# Patient Record
Sex: Female | Born: 1965 | Race: White | Hispanic: No | Smoking: Never smoker
Health system: Southern US, Community
[De-identification: ages and names within clinical notes are randomized; demographics above are authoritative.]

## PROBLEM LIST (undated history)

## (undated) DIAGNOSIS — J45909 Unspecified asthma, uncomplicated: Secondary | ICD-10-CM

## (undated) DIAGNOSIS — D6861 Antiphospholipid syndrome: Secondary | ICD-10-CM

## (undated) HISTORY — PX: CHOLECYSTECTOMY: SHX55

---

## 2004-12-26 ENCOUNTER — Encounter: Admission: RE | Admit: 2004-12-26 | Discharge: 2004-12-26 | Payer: Self-pay | Admitting: Neurology

## 2005-05-31 ENCOUNTER — Ambulatory Visit: Payer: Self-pay | Admitting: Cardiovascular Disease

## 2005-06-16 ENCOUNTER — Ambulatory Visit: Payer: Self-pay | Admitting: Cardiovascular Disease

## 2006-08-15 ENCOUNTER — Ambulatory Visit: Payer: Self-pay | Admitting: Surgery

## 2006-09-28 ENCOUNTER — Ambulatory Visit (HOSPITAL_COMMUNITY): Admission: RE | Admit: 2006-09-28 | Discharge: 2006-09-28 | Payer: Self-pay | Admitting: Surgery

## 2008-09-23 ENCOUNTER — Ambulatory Visit: Payer: Self-pay | Admitting: Internal Medicine

## 2008-10-09 ENCOUNTER — Ambulatory Visit: Payer: Self-pay | Admitting: Internal Medicine

## 2008-10-09 ENCOUNTER — Ambulatory Visit: Payer: Self-pay | Admitting: Cardiovascular Disease

## 2008-10-21 ENCOUNTER — Ambulatory Visit: Payer: Self-pay | Admitting: Internal Medicine

## 2010-09-13 ENCOUNTER — Encounter: Payer: Self-pay | Admitting: Surgery

## 2011-05-03 ENCOUNTER — Emergency Department: Payer: Self-pay | Admitting: Unknown Physician Specialty

## 2014-05-12 ENCOUNTER — Emergency Department: Payer: Self-pay | Admitting: Emergency Medicine

## 2014-05-12 LAB — COMPREHENSIVE METABOLIC PANEL
ALK PHOS: 46 U/L
AST: 36 U/L (ref 15–37)
Albumin: 3.8 g/dL (ref 3.4–5.0)
Anion Gap: 12 (ref 7–16)
BILIRUBIN TOTAL: 0.4 mg/dL (ref 0.2–1.0)
BUN: 19 mg/dL — AB (ref 7–18)
CALCIUM: 8.6 mg/dL (ref 8.5–10.1)
CHLORIDE: 108 mmol/L — AB (ref 98–107)
CREATININE: 0.77 mg/dL (ref 0.60–1.30)
Co2: 21 mmol/L (ref 21–32)
GLUCOSE: 99 mg/dL (ref 65–99)
Osmolality: 284 (ref 275–301)
Potassium: 4.2 mmol/L (ref 3.5–5.1)
SGPT (ALT): 25 U/L
SODIUM: 141 mmol/L (ref 136–145)
Total Protein: 7.1 g/dL (ref 6.4–8.2)

## 2014-05-12 LAB — CBC
HCT: 40.2 % (ref 35.0–47.0)
HGB: 12.9 g/dL (ref 12.0–16.0)
MCH: 27.3 pg (ref 26.0–34.0)
MCHC: 32.1 g/dL (ref 32.0–36.0)
MCV: 85 fL (ref 80–100)
PLATELETS: 235 10*3/uL (ref 150–440)
RBC: 4.73 10*6/uL (ref 3.80–5.20)
RDW: 12.9 % (ref 11.5–14.5)
WBC: 7.1 10*3/uL (ref 3.6–11.0)

## 2014-05-12 LAB — PROTIME-INR
INR: 0.9
PROTHROMBIN TIME: 12.4 s (ref 11.5–14.7)

## 2014-05-12 LAB — APTT: Activated PTT: 26.1 secs (ref 23.6–35.9)

## 2014-05-12 LAB — HCG, QUANTITATIVE, PREGNANCY: Beta Hcg, Quant.: <1 m[IU]/mL — ABNORMAL LOW

## 2014-05-12 LAB — TROPONIN I: Troponin-I: <0.02 ng/mL

## 2014-11-05 ENCOUNTER — Emergency Department: Payer: Self-pay | Admitting: Emergency Medicine

## 2014-11-08 ENCOUNTER — Emergency Department: Payer: Self-pay | Admitting: Emergency Medicine

## 2014-12-14 NOTE — Consult Note (Signed)
PATIENT NAME:  Shannon Poole, Winston R MR#:  [id number] DATE OF BIRTH:  01-13-1966  DATE OF CONSULTATION:  05/12/2014  REFERRING PHYSICIAN:  Su Leyobert L. Kinner, MD with the Emergency Room.  CONSULTING PHYSICIAN:  Jenalyn Girdner R. Tyquon Near, MD  CHIEF COMPLAINT: Right-sided chest pain radiating to the arm.   HISTORY OF PRESENTING ILLNESS: A 49 year old female patient with a history of antiphospholipid syndrome, chronic on-and-off chest pain prior to the Emergency Room, complaining of acute onset of right-sided chest pain which started 3 days back. The patient was hoping this pain would improve, but this continued without any change and presented to the ER. She has been taking some ibuprofen at home without any relief. Here, the patient was initially found to be tachycardic, was thought to have possible PE secondary to her history of antiphospholipid syndrome. Had a V/Q scan done which is completely normal. The patient does not have any shortness of breath. No hypoxia. Did not have any recent surgeries, long flight or car journeys, or immobility. The patient [facility] and she was treated for pleurisy, had extensive workup without any acute illness. The patient also had similar chest pain in the past, had stress test with Dr. Welton FlakesKhan of cardiology in 2008, which was normal, although, unfortunately, etiology could not be found for these recurrent pains.   PAST MEDICAL HISTORY:  1.  Antiphospholipid syndrome.  2.  Miscarriages.  3.  Asthma.  4.  Migraines.  5.  Cholecystectomy.   SOCIAL HISTORY: The patient does not smoke. Rare alcohol use. Works as a Lawyer[employer]NA.   FAMILY HISTORY: No family history of DVT, PE.   ALLERGIES: IV DYE AND MACROBID.   HOME MEDICATIONS: Ibuprofen 600 mg 2 times a day as needed for pain.   REVIEW OF SYSTEMS: CONSTITUTIONAL: No fever, fatigue, weight loss, weight gain. EYES: No blurry vision, pain.  HENT: No tinnitus, pain, hearing loss.  RESPIRATORY: No  cough, wheeze, hemoptysis. CARDIOVASCULAR: No chest pain, orthopnea, edema.  GASTROINTESTINAL: No nausea, vomiting, diarrhea, abdominal pain.  GENITOURINARY: No dysuria, hematuria, or frequency.  ENDOCRINE: No polyuria, nocturia, thyroid problems. HEMATOLOGIC AND LYMPHATIC: Has antiphospholipid syndrome.  MUSCULOSKELETAL: Has the right shoulder pain and chest pain.  NEUROLOGIC: No focal numbness, weakness, seizure.  PSYCHIATRIC: Has anxiety. No depression.  PHYSICAL EXAMINATION: Shows:  VITAL SIGNS: Temperature of 98.7. Initially pulse elevated at 120, presently improved to 90.  GENERAL: A morbidly obese Caucasian female patient lying in bed in distress secondary to her pain in the right shoulder chest area.  PSYCHIATRIC: Alert and oriented x 3, anxious.  HEENT: Atraumatic, normocephalic. Oral mucosa moist and pink. External ears normal. No pallor. No icterus. Pupils bilaterally equal and reactive to light.  NECK: Supple . Trachea midline. No carotid bruit or JVD. CARDIOVASCULAR: S1, S2 without any murmurs. Peripheral pulses 2+. No edema.  RESPIRATORY: Normal work of breathing. Clear to auscultation on both sides.  GASTROINTESTINAL: Soft abdomen, nontender. Bowel sounds present. No organomegaly palpable.  SKIN: Warm and dry. No petechiae, rash, ulcers.  MUSCULOSKELETAL: No joint tenderness in large joints. Normal muscle tone.  NEUROLOGIC: Motor strength 5/5 in upper and lower extremities. Sensation is intact all over.  LYMPHATIC: No cervical lymphadenopathy.   LABORATORY STUDIES: Show glucose of 99, BUN 19, creatinine 0.77, sodium 141, potassium 4.2, chloride 108, bicarbonate 21. AST, ALT, alkaline phosphatase, bilirubin normal. Troponin less than 0.02.   hemoglobin 12.9, platelets of 235,000 with INR of 0.98. PTT of 26.1.   EKG shows  normal sinus rhythm, nothing acute. Sinus tachycardia.   Chest x-ray looks clear.   V/Q scan negative for PE.   ASSESSMENT AND PLAN:  1.   Musculoskeletal right-sided chest-shoulder area pain. The patient will be given intravenous dose of Toradol along with a dose of Percocet and Flexeril. If the patient improves, can be discharged home. If the patient does not improve in the Emergency Room, may need admission. I discussed this plan with Dr. Cyril Loosen. She needs to follow up with her primary care physician. The patient's cardiac enzymes are normal. No risk factors for pulmonary embolism other than her antiphospholipid syndrome, but a V/Q scan has been negative which is reassuring. The patient's sinus tachycardia is likely from the pain, which has resolved here once the pain is improved.  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 Does not have any cough. No shortness of breath. Not an open case of tuberculosis. Will need INH and will follow up with her primary care physician.  3.  Antiphospholipid syndrome. Follow up with primary care physician.   TIME SPENT: Time spent today on this consult was 40 minutes.   ____________________________ Molinda Bailiff Izaiah Tabb, MD srs:ST D: 05/12/2014 21:44:17 ET T: 05/13/2014 00:25:46 ET JOB#: [id number]  cc: Wardell Heath R. Clydia Nieves, MD, <Dictator> Orie Fisherman MD ELECTRONICALLY SIGNED 05/15/2014 16:01

## 2015-04-09 ENCOUNTER — Observation Stay
Admission: EM | Admit: 2015-04-09 | Discharge: 2015-04-11 | Disposition: A | Discharge status: Home / Self Care | Payer: Worker's Compensation | Attending: Internal Medicine | Admitting: Internal Medicine

## 2015-04-09 ENCOUNTER — Encounter: Payer: Self-pay | Admitting: *Deleted

## 2015-04-09 ENCOUNTER — Other Ambulatory Visit: Payer: Self-pay

## 2015-04-09 ENCOUNTER — Emergency Department: Payer: Worker's Compensation

## 2015-04-09 DIAGNOSIS — Z9049 Acquired absence of other specified parts of digestive tract: Secondary | ICD-10-CM | POA: Insufficient documentation

## 2015-04-09 DIAGNOSIS — M545 Low back pain: Secondary | ICD-10-CM | POA: Insufficient documentation

## 2015-04-09 DIAGNOSIS — J45909 Unspecified asthma, uncomplicated: Secondary | ICD-10-CM | POA: Insufficient documentation

## 2015-04-09 DIAGNOSIS — R7989 Other specified abnormal findings of blood chemistry: Secondary | ICD-10-CM

## 2015-04-09 DIAGNOSIS — Z79899 Other long term (current) drug therapy: Secondary | ICD-10-CM | POA: Insufficient documentation

## 2015-04-09 DIAGNOSIS — R0602 Shortness of breath: Secondary | ICD-10-CM | POA: Insufficient documentation

## 2015-04-09 DIAGNOSIS — R0789 Other chest pain: Principal | ICD-10-CM | POA: Insufficient documentation

## 2015-04-09 DIAGNOSIS — Z885 Allergy status to narcotic agent status: Secondary | ICD-10-CM | POA: Insufficient documentation

## 2015-04-09 DIAGNOSIS — Z8249 Family history of ischemic heart disease and other diseases of the circulatory system: Secondary | ICD-10-CM | POA: Insufficient documentation

## 2015-04-09 DIAGNOSIS — R000 Tachycardia, unspecified: Secondary | ICD-10-CM | POA: Insufficient documentation

## 2015-04-09 DIAGNOSIS — E039 Hypothyroidism, unspecified: Secondary | ICD-10-CM | POA: Insufficient documentation

## 2015-04-09 DIAGNOSIS — D6861 Antiphospholipid syndrome: Secondary | ICD-10-CM | POA: Insufficient documentation

## 2015-04-09 DIAGNOSIS — Z808 Family history of malignant neoplasm of other organs or systems: Secondary | ICD-10-CM | POA: Insufficient documentation

## 2015-04-09 DIAGNOSIS — Z806 Family history of leukemia: Secondary | ICD-10-CM | POA: Insufficient documentation

## 2015-04-09 DIAGNOSIS — Z888 Allergy status to other drugs, medicaments and biological substances status: Secondary | ICD-10-CM | POA: Insufficient documentation

## 2015-04-09 DIAGNOSIS — Z91041 Radiographic dye allergy status: Secondary | ICD-10-CM | POA: Insufficient documentation

## 2015-04-09 DIAGNOSIS — R0781 Pleurodynia: Secondary | ICD-10-CM | POA: Insufficient documentation

## 2015-04-09 DIAGNOSIS — Z833 Family history of diabetes mellitus: Secondary | ICD-10-CM | POA: Insufficient documentation

## 2015-04-09 DIAGNOSIS — R079 Chest pain, unspecified: Secondary | ICD-10-CM | POA: Diagnosis present

## 2015-04-09 DIAGNOSIS — Z881 Allergy status to other antibiotic agents status: Secondary | ICD-10-CM | POA: Insufficient documentation

## 2015-04-09 HISTORY — DX: Unspecified asthma, uncomplicated: J45.909

## 2015-04-09 HISTORY — DX: Antiphospholipid syndrome: D68.61

## 2015-04-09 LAB — COMPREHENSIVE METABOLIC PANEL
ALBUMIN: 4.2 g/dL (ref 3.5–5.0)
ALT: 13 U/L — ABNORMAL LOW (ref 14–54)
AST: 23 U/L (ref 15–41)
Alkaline Phosphatase: 52 U/L (ref 38–126)
Anion gap: 10 (ref 5–15)
BILIRUBIN TOTAL: 0.6 mg/dL (ref 0.3–1.2)
BUN: 15 mg/dL (ref 6–20)
CHLORIDE: 104 mmol/L (ref 101–111)
CO2: 25 mmol/L (ref 22–32)
Calcium: 9 mg/dL (ref 8.9–10.3)
Creatinine, Ser: 0.96 mg/dL (ref 0.44–1.00)
GFR calc Af Amer: >60 mL/min (ref >60)
GFR calc non Af Amer: >60 mL/min (ref >60)
GLUCOSE: 91 mg/dL (ref 65–99)
POTASSIUM: 3.5 mmol/L (ref 3.5–5.1)
Sodium: 139 mmol/L (ref 135–145)
TOTAL PROTEIN: 7 g/dL (ref 6.5–8.1)

## 2015-04-09 LAB — FIBRIN DERIVATIVES D-DIMER (ARMC ONLY): Fibrin derivatives D-dimer (ARMC): 1014.89 — ABNORMAL HIGH (ref 0–499)

## 2015-04-09 LAB — CBC
HEMATOCRIT: 37.7 % (ref 35.0–47.0)
Hemoglobin: 12.1 g/dL (ref 12.0–16.0)
MCH: 24.8 pg — ABNORMAL LOW (ref 26.0–34.0)
MCHC: 32.1 g/dL (ref 32.0–36.0)
MCV: 77.3 fL — ABNORMAL LOW (ref 80.0–100.0)
Platelets: 250 10*3/uL (ref 150–440)
RBC: 4.88 MIL/uL (ref 3.80–5.20)
RDW: 14.9 % — AB (ref 11.5–14.5)
WBC: 6.3 10*3/uL (ref 3.6–11.0)

## 2015-04-09 LAB — CK: Total CK: 124 U/L (ref 38–234)

## 2015-04-09 LAB — TROPONIN I: Troponin I: <0.03 ng/mL (ref <0.031)

## 2015-04-09 LAB — POCT PREGNANCY, URINE: PREG TEST UR: NEGATIVE

## 2015-04-09 MED ORDER — ENOXAPARIN SODIUM 150 MG/ML ~~LOC~~ SOLN: 1.0000 mg/kg | Freq: Once | SUBCUTANEOUS | Status: AC

## 2015-04-09 MED ORDER — OXYCODONE-ACETAMINOPHEN 5-325 MG PO TABS
2.0000 | ORAL_TABLET | ORAL | Status: AC
  Administered 2015-04-09: 2 via ORAL
  Filled 2015-04-09: qty 2

## 2015-04-09 MED ORDER — ENOXAPARIN SODIUM 100 MG/ML ~~LOC~~ SOLN
SUBCUTANEOUS | Status: AC
  Administered 2015-04-09: 70 mg
  Filled 2015-04-09: qty 1

## 2015-04-09 NOTE — ED Notes (Signed)
Admitting physician in to evaluate patient for admission.

## 2015-04-09 NOTE — ED Notes (Signed)
Pt has chest pain.  Sx began 3 days ago.  States it hurts to take a deep breath.  Pt seen at urgent care today, sent to er for an eval.  Pt anxious.  Nausea today. No v/d.

## 2015-04-09 NOTE — H&P (Signed)
[facility] [place] [facility]   PATIENT NAME: Shannon Poole    MR#:  [id number]  DATE OF BIRTH:  02-27-1966  DATE OF ADMISSION:  04/09/2015  PRIMARY CARE PHYSICIAN: No primary care provider on file.   REQUESTING/REFERRING PHYSICIAN: Quale  CHIEF COMPLAINT:   Chief Complaint  Patient presents with  . Chest Pain    HISTORY OF PRESENT ILLNESS:  Shannon Poole  is a 49 y.o. female with a known history of antiphospholipid syndrome, hypothyroidism presents to the emergency room with the complaints of ongoing chest discomfort for the past 3 days. 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 Chest pain increases on deep inspiration and movements off chest wall. Mild associated shortness of breath intermittently +. Denies any palpitations, dizziness, loss of consciousness. No history of any recent fever, cough, vomiting, diarrhea mild nausea +. Patient gives history of antiphospholipid syndrome but denies any history of prior DVT or pulmonary embolism. History of heparin usage in one of her last pregnancy because of antiphospholipid syndrome. Evaluation in the ED revealed mild tachycardia with heart rate of 112 bpm, otherwise normal EKG and room air O2 saturations 100%. Chest x-ray negative for any acute cardiopulmonary pathology. CBC and CMP within normal limits. D-dimer elevated at 1014. Patient was started on therapeutic doses of subcutaneous Lovenox with a working diagnosis of pulmonary embolism because of chest pain in the setting of elevated d-dimer and history of antiphospholipid syndrome. VQ scan is ordered for tomorrow morning.  PAST MEDICAL HISTORY:   Past Medical History  Diagnosis Date  . Antiphospholipid syndrome    hypothyroidism  PAST SURGICAL HISTORY:   Past Surgical History  Procedure  Laterality Date  . Cholecystectomy    . Cesarean section N/A     SOCIAL HISTORY:   Social History  Substance Use Topics  . Smoking status: Never Smoker   . Smokeless tobacco: Not on file  . Alcohol Use: Yes    FAMILY HISTORY:   Family History  Problem Relation Age of Onset  . Diabetes Mother   . Leukemia Mother   . Congestive Heart Failure Father   . Cancer Other     DRUG ALLERGIES:   Allergies  Allergen Reactions  . Contrast Media [Iodinated Diagnostic Agents] Anaphylaxis  . Codeine Itching  . Macrolides And Ketolides   . Macrobid [employer] Macro] Rash    REVIEW OF SYSTEMS:   Review of Systems  Constitutional: Negative for fever, chills and malaise/fatigue.  HENT: Negative for ear pain, hearing loss, nosebleeds, sore throat and tinnitus.   Eyes: Negative for blurred vision, double vision, pain, discharge and redness.  Respiratory: Positive for shortness of breath. Negative for cough, hemoptysis, sputum production and wheezing.   Cardiovascular: Positive for chest pain. Negative for palpitations, orthopnea and leg swelling.  Gastrointestinal: Positive for nausea. Negative for vomiting, abdominal pain, diarrhea, constipation, blood in stool and melena.  Genitourinary: Negative for dysuria, urgency, frequency and hematuria.  Musculoskeletal: Negative for back pain, joint pain and neck pain.  Skin: Negative for itching and rash.  Neurological: Negative for dizziness, tingling, sensory change, focal weakness and seizures.  Endo/Heme/Allergies: Does not bruise/bleed easily.  Psychiatric/Behavioral: Negative for depression. The patient is not nervous/anxious.     MEDICATIONS AT HOME:   Prior to Admission medications   Medication Sig Start Date End  Date Taking? Authorizing Provider  cyclobenzaprine (FLEXERIL) 10 MG tablet Take 10 mg by mouth daily as needed for muscle spasms.   Yes Historical Provider, MD  levothyroxine (SYNTHROID, LEVOTHROID) 50 MCG  tablet Take 50 mcg by mouth daily before breakfast.   Yes Historical Provider, MD  naproxen (NAPROSYN) 500 MG tablet Take 500 mg by mouth 2 (two) times daily with a meal.   Yes Historical Provider, MD      VITAL SIGNS:  Blood pressure 110/87, pulse 74, temperature 98.2 F (36.8 C), temperature source Oral, resp. rate 22, height 5\' 4"  (1.626 m), weight 71.668 kg (158 lb), last menstrual period 04/09/2015, SpO2 100 %.  PHYSICAL EXAMINATION:  Physical Exam  Constitutional: She is oriented to person, place, and time. She appears well-developed and well-nourished.  Mild to moderate distress because of chest pain +  HENT:  Head: Normocephalic and atraumatic.  Right Ear: External ear normal.  Left Ear: External ear normal.  Nose: Nose normal.  Mouth/Throat: Oropharynx is clear and moist. No oropharyngeal exudate.  Eyes: EOM are normal. Pupils are equal, round, and reactive to light. No scleral icterus.  Neck: Normal range of motion. Neck supple. No JVD present. No thyromegaly present.  Cardiovascular: Normal rate, regular rhythm, normal heart sounds and intact distal pulses.  Exam reveals no friction rub.   No murmur heard. Respiratory: Effort normal and breath sounds normal. No respiratory distress. She has no wheezes. She has no rales. She exhibits no tenderness.  GI: Soft. Bowel sounds are normal. She exhibits no distension and no mass. There is no tenderness. There is no rebound and no guarding.  Musculoskeletal: Normal range of motion. She exhibits no edema.  Lymphadenopathy:    She has no cervical adenopathy.  Neurological: She is alert and oriented to person, place, and time. She has normal reflexes. She displays normal reflexes. No cranial nerve deficit. She exhibits normal muscle tone.  Skin: Skin is warm. No rash noted. No erythema.  Psychiatric: She has a normal mood and affect. Her behavior is normal. Thought content normal.   LABORATORY PANEL:   CBC  Recent Labs Lab  04/09/15 1842  WBC 6.3  HGB 12.1  HCT 37.7  PLT 250   ------------------------------------------------------------------------------------------------------------------  Chemistries   Recent Labs Lab 04/09/15 1842  NA 139  K 3.5  CL 104  CO2 25  GLUCOSE 91  BUN 15  CREATININE 0.96  CALCIUM 9.0  AST 23  ALT 13*  ALKPHOS 52  BILITOT 0.6   ------------------------------------------------------------------------------------------------------------------  Cardiac Enzymes  Recent Labs Lab 04/09/15 1842  TROPONINI <0.03   ------------------------------------------------------------------------------------------------------------------  RADIOLOGY:  Dg Chest 2 View  04/09/2015   CLINICAL DATA:  Left chest pain/injury  EXAM: CHEST  2 VIEW  COMPARISON:  05/12/2014  FINDINGS: Lungs are clear.  No pleural effusion or pneumothorax.  The heart is normal in size.  Mild degenerative changes of the visualized thoracolumbar spine.  IMPRESSION: No evidence of acute cardiopulmonary disease.   Electronically Signed   By: Charline Bills M.D.   On: 04/09/2015 19:06    EKG:   Orders placed or performed in visit on 05/12/14  . EKG 12-Lead  Sinus tachycardia with ventricular rate of 112 bpm, no acute ischemic changes.  IMPRESSION AND PLAN:   49 year old Caucasian female with history of antiphospholipid syndrome presents with the complaints of chest pain, found to be mild tachycardia, not hypoxic on room air, elevated d-dimer. Problem #1. Chest pain with history of antiphospholipid syndrome with elevated d-dimer,  mildly tachycardic and without any hypoxia. Likely musculoskeletal etiology in nature. However pulmonary embolism needs to be ruled out. Troponin 1 WNL, EKG no acute ischemic changes. Plan: Admit for observation, continue therapeutic Lovenox, pain control medications, monitor O2 sats, order VQ scan. 2. History of antiphospholipid syndrome. 3. Hypothyroidism, stable on  Synthroid. Continue same.   All the records are reviewed and case discussed with ED provider. Management plans discussed with the patient and  in agreement.  CODE STATUS: full code  TOTAL TIME TAKING CARE OF THIS PATIENT: 45 minutes.    Crissie Figures M.D on 04/09/2015 at 11:43 PM  Between 7am to 6pm - Pager - [phone]  After 6pm go to [email or web address] - password [email or web address] [facility]  [facility]  Office  [phone]  CC: Primary care physician; No primary care provider on file.

## 2015-04-09 NOTE — ED Notes (Signed)
Patient was inquiring about workers comp since her being here is because of the injury she recieved at work around the 9th of this month.  However, since she stated she filed for it and completed the drug screen  at urgent care the day after the incident, I informed her that another one is not necessary.

## 2015-04-09 NOTE — ED Provider Notes (Signed)
[facility] Emergency Department Provider Note  ____________________________________________  Time seen: Approximately 8:50 PM  I have reviewed the triage vital signs and the nursing notes.   HISTORY  Chief Complaint Chest Pain    HPI Shannon Poole is a 49 y.o. female with a history of ant-phospholipid syndromebeen no previous history of DVT or pulmonary emb. 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 She was seen in urgent care and evaluated. She reports that she has ongoing pain in her lower back, it is more concerned about sharp severe chest pains. She reports that whenever she goes to lift or move anything she has severe upper chest pain across her pectoralis muscles. In addition she reports some mild shortness of breath. She does report that she was told she had clots in her uterus during a previous pregnancy, but is never had a DVT or pulmonary embolus more required anticoagulation.  No heavy chest pressure. She reports her pain is definitely worse with lifting, and she went to work today but because of the amount of pain that she is having with movement she could not make to her shift.  No previous history of cardiac disease. No history of high blood pressure Or cholesterolemia. She is nonsmoker.   Past Medical History  Diagnosis Date  . Antiphospholipid syndrome     There are no active problems to display for this patient.   No past surgical history on file.  No current outpatient prescriptions on file.  Allergies Contrast media; Macrolides and ketolides; and Macrobid  No family history on file.  Social History Social History  Substance Use Topics  . Smoking status: Never Smoker   . Smokeless tobacco: Not on file  . Alcohol Use: Yes    Review of Systems Constitutional: No fever/chills Eyes: No visual  changes. ENT: No sore throat. Cardiovascular: See history of present illness Respiratory: Some shortness of breath, especially when her chest is hurting the most with movement. Gastrointestinal: No abdominal pain.  No nausea, no vomiting.  No diarrhea.  No constipation. Genitourinary: Negative for dysuria. Musculoskeletal: Right lower back pain, described as aching and a "strain" Skin: Negative for rash. Neurological: Negative for headaches, focal weakness or numbness.  10-point ROS otherwise negative.  ____________________________________________   PHYSICAL EXAM:  VITAL SIGNS: ED Triage Vitals  Enc Vitals Group     BP 04/09/15 1839 100/62 mmHg     Pulse Rate 04/09/15 1839 96     Resp 04/09/15 1839 20     Temp 04/09/15 1839 98.2 F (36.8 C)     Temp Source 04/09/15 1839 Oral     SpO2 04/09/15 1839 100 %     Weight 04/09/15 1839 158 lb (71.668 kg)     Height 04/09/15 1839  (1.626 m)     Head Cir --      Peak Flow --      Pain Score 04/09/15 1839 10     Pain Loc --      Pain Edu? --      Excl. in GC? --     Constitutional: Alert and oriented. Well appearing and in no acute distress. Eyes: Conjunctivae are normal. PERRL. EOMI. Head: Atraumatic. Nose: No congestion/rhinnorhea. Mouth/Throat: Mucous membranes are moist.  Oropharynx non-erythematous. Neck: No stridor.   Cardiovascular: Normal rate, regular rhythm. Grossly normal heart sounds.  Good peripheral circulation. Respiratory: Normal respiratory effort.  No retractions. Lungs CTAB. Gastrointestinal: Soft and nontender. No distention. No abdominal bruits. No CVA tenderness. Musculoskeletal: No reproducible anterior chest tenderness, but does report tenderness across the chest and abducting the arms. Patient has moderate tenderness to palpation of the right paraspinous muscles along the lumbar spine. No lower extremity tenderness nor edema.  No joint effusions. Neurologic:  Normal speech and language. No gross focal  neurologic deficits are appreciated. No gait instability. Skin:  Skin is warm, dry and intact. No rash noted. Psychiatric: Mood and affect are normal. Speech and behavior are normal.  ____________________________________________   LABS (all labs ordered are listed, but only abnormal results are displayed)  Labs Reviewed  CBC - Abnormal; Notable for the following:    MCV 77.3 (*)    MCH 24.8 (*)    RDW 14.9 (*)    All other components within normal limits  COMPREHENSIVE METABOLIC PANEL - Abnormal; Notable for the following:    ALT 13 (*)    All other components within normal limits  FIBRIN DERIVATIVES D-DIMER ([facility] ONLY) - Abnormal; Notable for the following:    Fibrin derivatives D-dimer [facility]) 1014.89 (*)    All other components within normal limits  TROPONIN I  CK  POC URINE PREG, ED  POCT PREGNANCY, URINE   ____________________________________________  EKG Reviewed and are reviewed by me Sinus tachycardia PR 1:30 QRS 70 QTc 460 No T-wave abnormalities Interpreted as sinus tachycardia otherwise normal ____________________________________________  RADIOLOGY     DG Chest 2 View (Final result) Result time: 04/09/15 19:06:56   Final result by Rad Results In Interface (04/09/15 19:06:56)   Narrative:   CLINICAL DATA: Left chest pain/injury  EXAM: CHEST 2 VIEW  COMPARISON: 05/12/2014  FINDINGS: Lungs are clear. No pleural effusion or pneumothorax.  The heart is normal in size.  Mild degenerative changes of the visualized thoracolumbar spine.  IMPRESSION: No evidence of acute cardiopulmonary disease.     ____________________________________________   PROCEDURES  Procedure(s) performed: None  Critical Care performed: No  ____________________________________________   INITIAL IMPRESSION / ASSESSMENT AND PLAN / ED COURSE  Pertinent labs & imaging results that were available during my care of the patient were reviewed by me and considered in  my medical decision making (see chart for details).  Pleuritic type chest pain, possibly musculoskeletal in nature but also patient has a history of anti-phospholipid syndrome. She reports she was previously had been on heparin during her pregnancy and developed clots around the uterus, though this is a little bit unclear I do not believe she speaks history of DVT.  Chest pain seems more muscular skeletal in nature, but given tachycardia and 110 on arrival unable to use PERC. D-dimer is positive, and patient has anaphylaxis to contrast dye. Troponin negative, doubtful this represents acute coronary syndrome but does require rule out for pulmonary embolism.  Discussed with the patient and hospitalist, patient being admitted to the hospital for further workup of pleuritic chest pain. We'll empirically give 1 dose of Lovenox overnight, patient denies any history of bleeding problems. Not having active bleeding.  ____________________________________________   FINAL CLINICAL IMPRESSION(S) / ED DIAGNOSES  Final diagnoses:  Chest pain, pleuritic      Sharyn Creamer, MD 04/09/15 2221

## 2015-04-10 ENCOUNTER — Observation Stay: Payer: Worker's Compensation

## 2015-04-10 LAB — TROPONIN I: Troponin I: <0.03 ng/mL (ref <0.031)

## 2015-04-10 MED ORDER — MORPHINE SULFATE (PF) 2 MG/ML IV SOLN: 2.0000 mg | INTRAVENOUS | Status: AC

## 2015-04-10 MED ORDER — ASPIRIN EC 81 MG PO TBEC
81.0000 mg | DELAYED_RELEASE_TABLET | Freq: Every day | ORAL | Status: DC
  Administered 2015-04-10 – 2015-04-11 (×2): 81 mg via ORAL
  Filled 2015-04-10 (×2): qty 1

## 2015-04-10 MED ORDER — MORPHINE SULFATE (PF) 2 MG/ML IV SOLN
2.0000 mg | INTRAVENOUS | Status: DC | PRN
  Administered 2015-04-10 (×5): 2 mg via INTRAVENOUS
  Filled 2015-04-10 (×4): qty 1

## 2015-04-10 MED ORDER — TECHNETIUM TO 99M ALBUMIN AGGREGATED
3.0000 | Freq: Once | INTRAVENOUS | Status: AC | PRN
  Administered 2015-04-10: 2.05 via INTRAVENOUS

## 2015-04-10 MED ORDER — ACETAMINOPHEN 325 MG PO TABS: 650.0000 mg | ORAL_TABLET | Freq: Four times a day (QID) | ORAL | Status: DC | PRN

## 2015-04-10 MED ORDER — ENOXAPARIN SODIUM 80 MG/0.8ML ~~LOC~~ SOLN
1.0000 mg/kg | Freq: Two times a day (BID) | SUBCUTANEOUS | Status: DC
  Administered 2015-04-10: 70 mg via SUBCUTANEOUS
  Filled 2015-04-10: qty 0.8

## 2015-04-10 MED ORDER — NAPROXEN 500 MG PO TABS
ORAL_TABLET | ORAL | Status: AC
  Filled 2015-04-10: qty 1

## 2015-04-10 MED ORDER — LEVOTHYROXINE SODIUM 50 MCG PO TABS
50.0000 ug | ORAL_TABLET | Freq: Every day | ORAL | Status: DC
  Administered 2015-04-10 – 2015-04-11 (×2): 50 ug via ORAL
  Filled 2015-04-10 (×2): qty 1

## 2015-04-10 MED ORDER — MORPHINE SULFATE (PF) 2 MG/ML IV SOLN
INTRAVENOUS | Status: AC
  Filled 2015-04-10: qty 1

## 2015-04-10 MED ORDER — ONDANSETRON HCL 4 MG/2ML IJ SOLN: 4.0000 mg | Freq: Four times a day (QID) | INTRAMUSCULAR | Status: DC | PRN

## 2015-04-10 MED ORDER — OXYCODONE-ACETAMINOPHEN 5-325 MG PO TABS
1.0000 | ORAL_TABLET | ORAL | Status: DC | PRN
  Administered 2015-04-10 – 2015-04-11 (×2): 2 via ORAL
  Filled 2015-04-10 (×2): qty 2

## 2015-04-10 MED ORDER — ONDANSETRON HCL 4 MG PO TABS: 4.0000 mg | ORAL_TABLET | Freq: Four times a day (QID) | ORAL | Status: DC | PRN

## 2015-04-10 MED ORDER — SODIUM CHLORIDE 0.9 % IJ SOLN
3.0000 mL | Freq: Two times a day (BID) | INTRAMUSCULAR | Status: DC
  Administered 2015-04-10 – 2015-04-11 (×3): 3 mL via INTRAVENOUS

## 2015-04-10 MED ORDER — METHYLPREDNISOLONE SODIUM SUCC 125 MG IJ SOLR
60.0000 mg | Freq: Once | INTRAMUSCULAR | Status: AC
  Administered 2015-04-10: 60 mg via INTRAVENOUS
  Filled 2015-04-10: qty 2

## 2015-04-10 MED ORDER — ENOXAPARIN SODIUM 40 MG/0.4ML ~~LOC~~ SOLN
40.0000 mg | SUBCUTANEOUS | Status: DC
  Administered 2015-04-11: 40 mg via SUBCUTANEOUS
  Filled 2015-04-10: qty 0.4

## 2015-04-10 MED ORDER — ACETAMINOPHEN 650 MG RE SUPP: 650.0000 mg | Freq: Four times a day (QID) | RECTAL | Status: DC | PRN

## 2015-04-10 MED ORDER — CYCLOBENZAPRINE HCL 10 MG PO TABS
10.0000 mg | ORAL_TABLET | Freq: Three times a day (TID) | ORAL | Status: DC | PRN
  Administered 2015-04-10: 10 mg via ORAL
  Filled 2015-04-10: qty 1

## 2015-04-10 MED ORDER — TECHNETIUM TC 99M DIETHYLENETRIAME-PENTAACETIC ACID
43.2300 | Freq: Once | INTRAVENOUS | Status: DC | PRN
  Administered 2015-04-10: 43.23 via RESPIRATORY_TRACT
  Filled 2015-04-10: qty 43.23

## 2015-04-10 NOTE — Progress Notes (Signed)
Per MD give patient one time dose of 2 mg morphine stat IV

## 2015-04-10 NOTE — Progress Notes (Signed)
[facility] - [place] at [facility]   PATIENT NAME: Shannon Poole    MR#:  [id number]  DATE OF BIRTH:  01-30-1966  SUBJECTIVE:  CHIEF COMPLAINT:   Chief Complaint  Patient presents with  . Chest Pain   Complaints of pleuritic left chest pain. VQ scan is negative. D-dimer was elevated on admission.  REVIEW OF SYSTEMS:  Review of Systems  Constitutional: Negative for fever and chills.  HENT: Negative for ear pain.   Respiratory: Negative for cough, shortness of breath and wheezing.   Cardiovascular: Positive for chest pain. Negative for palpitations.       Pleuritic chest pain present.  Gastrointestinal: Negative for nausea, vomiting, abdominal pain, diarrhea and constipation.  Genitourinary: Negative for dysuria and frequency.  Neurological: Negative for dizziness, seizures, weakness and headaches.  Psychiatric/Behavioral: Negative for depression.    DRUG ALLERGIES:   Allergies  Allergen Reactions  . Contrast Media [Iodinated Diagnostic Agents] Anaphylaxis  . Codeine Itching  . Macrolides And Ketolides   . Macrobid [Nitrofurantoin Monohyd Macro] Rash    VITALS:  Blood pressure 107/76, pulse 89, temperature 98.3 F (36.8 C), temperature source Oral, resp. rate 18, height  (1.626 m), weight 70.081 kg (154 lb 8 oz), last menstrual period 04/08/2015, SpO2 100 %.  PHYSICAL EXAMINATION:  Physical Exam  GENERAL:  49 y.o.-year-old patient lying in the bed with no acute distress.  EYES: Pupils equal, round, reactive to light and accommodation. No scleral icterus. Extraocular muscles intact.  HEENT: Head atraumatic, normocephalic. Oropharynx and nasopharynx clear.  NECK:  Supple, no jugular venous distention. No thyroid enlargement, no tenderness.  LUNGS: Normal breath sounds bilaterally, no wheezing, rales,rhonchi or crepitation. No use of accessory muscles of respiration.  CARDIOVASCULAR: S1, S2 normal. No murmurs, rubs, or gallops.  Chest  clear on exam with inspection . no significant palpable tenderness noted. ABDOMEN: Soft, nontender, nondistended. Bowel sounds present. No organomegaly or mass.  EXTREMITIES: No pedal edema, cyanosis, or clubbing.  NEUROLOGIC: Cranial nerves II through XII are intact. Muscle strength 5/5 in all extremities. Sensation intact. Gait not checked.  PSYCHIATRIC: The patient is alert and oriented x 3.  SKIN: No obvious rash, lesion, or ulcer.    LABORATORY PANEL:   CBC  Recent Labs Lab 04/09/15 1842  WBC 6.3  HGB 12.1  HCT 37.7  PLT 250   ------------------------------------------------------------------------------------------------------------------  Chemistries   Recent Labs Lab 04/09/15 1842  NA 139  K 3.5  CL 104  CO2 25  GLUCOSE 91  BUN 15  CREATININE 0.96  CALCIUM 9.0  AST 23  ALT 13*  ALKPHOS 52  BILITOT 0.6   ------------------------------------------------------------------------------------------------------------------  Cardiac Enzymes  Recent Labs Lab 04/09/15 1842  TROPONINI <0.03   ------------------------------------------------------------------------------------------------------------------  RADIOLOGY:  Dg Chest 2 View  04/09/2015   CLINICAL DATA:  Left chest pain/injury  EXAM: CHEST  2 VIEW  COMPARISON:  05/12/2014  FINDINGS: Lungs are clear.  No pleural effusion or pneumothorax.  The heart is normal in size.  Mild degenerative changes of the visualized thoracolumbar spine.  IMPRESSION: No evidence of acute cardiopulmonary disease.   Electronically Signed   By: Charline Bills M.D.   On: 04/09/2015 19:06   Nm Pulmonary Perf And Vent  04/10/2015   CLINICAL DATA:  Chest pain and shortness of breath.  EXAM: NUCLEAR MEDICINE VENTILATION - PERFUSION LUNG SCAN  TECHNIQUE: Ventilation images were obtained in multiple projections using inhaled aerosol Tc-77m DTPA. Perfusion images were obtained in multiple projections after intravenous  injection of  Tc-52m MAA.  RADIOPHARMACEUTICALS:  42.234 mCi of Technetium-36m DTPA aerosol inhalation and 2.05 mCi of Technetium-16m MAA IV  COMPARISON:  Chest x-ray dated 04/09/2015  FINDINGS: Ventilation: No focal ventilation defect.  Perfusion: No wedge shaped peripheral perfusion defects to suggest acute pulmonary embolism.  IMPRESSION: Normal ventilation-perfusion lung scan. No evidence of pulmonary embolism.   Electronically Signed   By: Francene Boyers M.D.   On: 04/10/2015 14:07    EKG:   Orders placed or performed in visit on 05/12/14  . EKG 12-Lead    ASSESSMENT AND PLAN:   49 year old female with history of hypothyroidism and anti-phospholipid syndrome admitted to the hospital secondary to chest pain.  #1 chest pain-pleuritic in nature. -Either musculoskeletal in origin, pulmonary embolism needs to be ruled out due to her history of anti-phospholipid syndrome. -Since she is allergic to contrast, VQ scan has been ordered. D-dimer was elevated on admission. Also Dopplers of lower extremities will be done today. -Therapeutic dose of Lovenox given. Now that VQ scan is negative change Lovenox to daily. -Start muscle relaxants and pain medication. If no improvement try steroids.  #2 hypothyroidism-continue her Synthroid.  #3 anti-phospholipid syndrome-no active clots. On Lovenox prophylactically.  #4 DVT prophylaxis-on Lovenox.    All the records are reviewed and case discussed with Care Management/Social Workerr. Management plans discussed with the patient, family and they are in agreement.  CODE STATUS: Full code  TOTAL TIME TAKING CARE OF THIS PATIENT: 38 minutes.   POSSIBLE D/C IN 1 DAY, DEPENDING ON CLINICAL CONDITION.   Enid Baas M.D on 04/10/2015 at 2:29 PM  Between 7am to 6pm - Pager - [phone]  After 6pm go to [email or web address] - password [email or web address] [facility]  [facility]  Office  [phone]  CC: Primary care physician; No primary care provider on file.

## 2015-04-10 NOTE — Plan of Care (Signed)
Problem: Phase I Progression Outcomes Goal: Other Phase II Outcomes/Goals Outcome: Completed/Met Date Met:  04/10/15 Pt is alert and oriented x 4, up to bathroom independently, on room air, vital signs stable, c/o chest pain improved with morphine iV, lung scan and [place] of lower extremities negative for DVT and PE, patient started on IV steroids, good appetite, resting in between care, troponin remain negative, uneventful shift.

## 2015-04-10 NOTE — Progress Notes (Signed)
A&O. Independent. Admitted for chest pain, Ddimer positive. VQ scan today. SKin verified with Tiffany.

## 2015-04-11 DIAGNOSIS — R0789 Other chest pain: Secondary | ICD-10-CM

## 2015-04-11 LAB — BASIC METABOLIC PANEL
Anion gap: 5 (ref 5–15)
BUN: 21 mg/dL — ABNORMAL HIGH (ref 6–20)
CALCIUM: 9 mg/dL (ref 8.9–10.3)
CO2: 29 mmol/L (ref 22–32)
CREATININE: 0.7 mg/dL (ref 0.44–1.00)
Chloride: 105 mmol/L (ref 101–111)
GFR calc non Af Amer: >60 mL/min (ref >60)
GLUCOSE: 151 mg/dL — AB (ref 65–99)
Potassium: 4.6 mmol/L (ref 3.5–5.1)
Sodium: 139 mmol/L (ref 135–145)

## 2015-04-11 LAB — CK: Total CK: 91 U/L (ref 38–234)

## 2015-04-11 MED ORDER — OXYCODONE-ACETAMINOPHEN 5-325 MG PO TABS: 1.0000 | ORAL_TABLET | Freq: Four times a day (QID) | ORAL | Status: DC | PRN

## 2015-04-11 MED ORDER — PREDNISONE 10 MG (21) PO TBPK: 10.0000 mg | ORAL_TABLET | Freq: Every day | ORAL | Status: DC

## 2015-04-11 NOTE — Discharge Summary (Signed)
[facility] - [place] at [facility]   PATIENT NAME: Shannon Poole    MR#:  [id number]  DATE OF BIRTH:  28-Mar-1966  DATE OF ADMISSION:  04/09/2015 ADMITTING PHYSICIAN: Crissie Figures, MD  DATE OF DISCHARGE: 04/11/15  PRIMARY CARE PHYSICIAN: No primary care provider on file.    ADMISSION DIAGNOSIS:  Chest pain, pleuritic [R07.81]  DISCHARGE DIAGNOSIS:  Principal Problem:   Chest pain Active Problems:   Musculoskeletal chest pain   SECONDARY DIAGNOSIS:   Past Medical History  Diagnosis Date  . Antiphospholipid syndrome   . Asthma     HOSPITAL COURSE:   49 year old female with history of hypothyroidism and anti-phospholipid syndrome admitted to the hospital secondary to chest pain.  #1 chest pain-pleuritic in nature. -likely musculoskeletal in origin, with inflammatory symptoms -Negative for  pulmonary embolism and DVT based on VQ scan and dopplers -improving with muscle relaxants and steroids- discharge on the same.  #2 hypothyroidism-continue her Synthroid.  #3 anti-phospholipid syndrome-no active clots.   DISCHARGE CONDITIONS:   Stable  CONSULTS OBTAINED:  Treatment Team:  Enid Baas, MD  DRUG ALLERGIES:   Allergies  Allergen Reactions  . Contrast Media [Iodinated Diagnostic Agents] Anaphylaxis  . Codeine Itching  . Macrolides And Ketolides   . Macrobid [Nitrofurantoin Monohyd Macro] Rash    DISCHARGE MEDICATIONS:   Current Discharge Medication List    START taking these medications   Details  oxyCODONE-acetaminophen (PERCOCET/ROXICET) 5-325 MG per tablet Take 1-2 tablets by mouth every 6 (six) hours as needed for moderate pain. Qty: 20 tablet, Refills: 0    predniSONE (STERAPRED UNI-PAK 21 TAB) 10 MG (21) TBPK tablet Take 1 tablet (10 mg total) by mouth daily. 6 tabs PO x 1 day 5 tabs PO x 1 day 4 tabs PO x 1 day 3 tabs PO x 1 day 2 tabs PO x 1 day 1 tab PO x 1 day and stop Qty: 21 tablet, Refills: 0       CONTINUE these medications which have NOT CHANGED   Details  cyclobenzaprine (FLEXERIL) 10 MG tablet Take 10 mg by mouth daily as needed for muscle spasms.    levothyroxine (SYNTHROID, LEVOTHROID) 50 MCG tablet Take 50 mcg by mouth daily before breakfast.      STOP taking these medications     naproxen (NAPROSYN) 500 MG tablet          DISCHARGE INSTRUCTIONS:   1. F/u with physician in 1 week if no improvement  If you experience worsening of your admission symptoms, develop shortness of breath, life threatening emergency, suicidal or homicidal thoughts you must seek medical attention immediately by calling 911 or calling your MD immediately  if symptoms less severe.  You Must read complete instructions/literature along with all the possible adverse reactions/side effects for all the Medicines you take and that have been prescribed to you. Take any new Medicines after you have completely understood and accept all the possible adverse reactions/side effects.   Please note  You were cared for by a hospitalist during your hospital stay. If you have any questions about your discharge medications or the care you received while you were in the hospital after you are discharged, you can call the unit and asked to speak with the hospitalist on call if the hospitalist that took care of you is not available. Once you are discharged, your primary care physician will handle any further medical issues. Please note that NO REFILLS for any discharge medications will be  authorized once you are discharged, as it is imperative that you return to your primary care physician (or establish a relationship with a primary care physician if you do not have one) for your aftercare needs so that they can reassess your need for medications and monitor your lab values.    Today   CHIEF COMPLAINT:   Chief Complaint  Patient presents with  . Chest Pain     VITAL SIGNS:  Blood pressure 98/60, pulse 70,  temperature 97.8 F (36.6 C), temperature source Oral, resp. rate 18, height  (1.626 m), weight 69.718 kg (153 lb 11.2 oz), last menstrual period 04/08/2015, SpO2 98 %.  I/O:   Intake/Output Summary (Last 24 hours) at 04/11/15 0931 Last data filed at 04/11/15 1191  Gross per 24 hour  Intake    240 ml  Output   1450 ml  Net  -1210 ml    PHYSICAL EXAMINATION:   Physical Exam  GENERAL:  49 y.o.-year-old patient lying in the bed with no acute distress.  EYES: Pupils equal, round, reactive to light and accommodation. No scleral icterus. Extraocular muscles intact.  HEENT: Head atraumatic, normocephalic. Oropharynx and nasopharynx clear.  NECK:  Supple, no jugular venous distention. No thyroid enlargement, no tenderness.  LUNGS: Normal breath sounds bilaterally, no wheezing, rales,rhonchi or crepitation. No use of accessory muscles of respiration.  CARDIOVASCULAR: S1, S2 normal. No murmurs, rubs, or gallops.  ABDOMEN: Soft, non-tender, non-distended. Bowel sounds present. No organomegaly or mass.  EXTREMITIES: No pedal edema, cyanosis, or clubbing.  NEUROLOGIC: Cranial nerves II through XII are intact. Muscle strength 5/5 in all extremities. Sensation intact. Gait not checked.  PSYCHIATRIC: The patient is alert and oriented x 3.  SKIN: No obvious rash, lesion, or ulcer.   DATA REVIEW:   CBC  Recent Labs Lab 04/09/15 1842  WBC 6.3  HGB 12.1  HCT 37.7  PLT 250    Chemistries   Recent Labs Lab 04/09/15 1842 04/11/15 0439  NA 139 139  K 3.5 4.6  CL 104 105  CO2 25 29  GLUCOSE 91 151*  BUN 15 21*  CREATININE 0.96 0.70  CALCIUM 9.0 9.0  AST 23  --   ALT 13*  --   ALKPHOS 52  --   BILITOT 0.6  --     Cardiac Enzymes  Recent Labs Lab 04/10/15 2024  TROPONINI <0.03    Microbiology Results  No results found for this or any previous visit.  RADIOLOGY:  Dg Chest 2 View  04/09/2015   CLINICAL DATA:  Left chest pain/injury  EXAM: CHEST  2 VIEW  COMPARISON:   05/12/2014  FINDINGS: Lungs are clear.  No pleural effusion or pneumothorax.  The heart is normal in size.  Mild degenerative changes of the visualized thoracolumbar spine.  IMPRESSION: No evidence of acute cardiopulmonary disease.   Electronically Signed   By: Charline Bills M.D.   On: 04/09/2015 19:06   Nm Pulmonary Perf And Vent  04/10/2015   CLINICAL DATA:  Chest pain and shortness of breath.  EXAM: NUCLEAR MEDICINE VENTILATION - PERFUSION LUNG SCAN  TECHNIQUE: Ventilation images were obtained in multiple projections using inhaled aerosol Tc-44m DTPA. Perfusion images were obtained in multiple projections after intravenous injection of Tc-35m MAA.  RADIOPHARMACEUTICALS:  42.234 mCi of Technetium-31m DTPA aerosol inhalation and 2.05 mCi of Technetium-15m MAA IV  COMPARISON:  Chest x-ray dated 04/09/2015  FINDINGS: Ventilation: No focal ventilation defect.  Perfusion: No wedge shaped peripheral perfusion defects to suggest  acute pulmonary embolism.  IMPRESSION: Normal ventilation-perfusion lung scan. No evidence of pulmonary embolism.   Electronically Signed   By: Francene Boyers M.D.   On: 04/10/2015 14:07   US Venous Img Lower Bilateral  04/10/2015   CLINICAL DATA:  Chest pain and elevated D-dimer  EXAM: BILATERAL LOWER EXTREMITY VENOUS DOPPLER ULTRASOUND  TECHNIQUE: Gray-scale sonography with graded compression, as well as color Doppler and duplex ultrasound were performed to evaluate the lower extremity deep venous systems from the level of the common femoral vein and including the common femoral, femoral, profunda femoral, popliteal and calf veins including the posterior tibial, peroneal and gastrocnemius veins when visible. The superficial great saphenous vein was also interrogated. Spectral Doppler was utilized to evaluate flow at rest and with distal augmentation maneuvers in the common femoral, femoral and popliteal veins.  COMPARISON:  None.  FINDINGS: RIGHT LOWER EXTREMITY  Common Femoral Vein:  No evidence of thrombus. Normal compressibility, respiratory phasicity and response to augmentation.  Saphenofemoral Junction: No evidence of thrombus. Normal compressibility and flow on color Doppler imaging.  Profunda Femoral Vein: No evidence of thrombus. Normal compressibility and flow on color Doppler imaging.  Femoral Vein: No evidence of thrombus. Normal compressibility, respiratory phasicity and response to augmentation.  Popliteal Vein: No evidence of thrombus. Normal compressibility, respiratory phasicity and response to augmentation.  Calf Veins: No evidence of thrombus. Normal compressibility and flow on color Doppler imaging.  Superficial Great Saphenous Vein: No evidence of thrombus. Normal compressibility and flow on color Doppler imaging.  Venous Reflux:  None.  Other Findings:  None.  LEFT LOWER EXTREMITY  Common Femoral Vein: No evidence of thrombus. Normal compressibility, respiratory phasicity and response to augmentation.  Saphenofemoral Junction: No evidence of thrombus. Normal compressibility and flow on color Doppler imaging.  Profunda Femoral Vein: No evidence of thrombus. Normal compressibility and flow on color Doppler imaging.  Femoral Vein: No evidence of thrombus. Normal compressibility, respiratory phasicity and response to augmentation.  Popliteal Vein: No evidence of thrombus. Normal compressibility, respiratory phasicity and response to augmentation.  Calf Veins: No evidence of thrombus. Normal compressibility and flow on color Doppler imaging.  Superficial Great Saphenous Vein: No evidence of thrombus. Normal compressibility and flow on color Doppler imaging.  Venous Reflux:  None.  Other Findings:  None.  IMPRESSION: No evidence of deep venous thrombosis.   Electronically Signed   By: Alcide Clever M.D.   On: 04/10/2015 16:06    EKG:   Orders placed or performed in visit on 05/12/14  . EKG 12-Lead      Management plans discussed with the patient, family and they are in  agreement.  CODE STATUS:     Code Status Orders        Start     Ordered   04/10/15 0045  Full code   Continuous     04/10/15 0044      TOTAL TIME TAKING CARE OF THIS PATIENT: 40 minutes.    Enid Baas M.D on 04/11/2015 at 9:31 AM  Between 7am to 6pm - Pager - [phone]  After 6pm go to [email or web address] - password [email or web address] [facility]  [place] [facility]  Office  [phone]  CC: Primary care physician; No primary care provider on file.

## 2015-04-11 NOTE — Progress Notes (Signed)
IV and tele removed. Work note given to pt. Discharge instructions given to pt. Pt has not reported any pain. A & O. Takes meds ok. Pt has no further concerns at this time.

## 2015-04-11 NOTE — Progress Notes (Signed)
     Shirley Decamp was admitted to the [facility] on 04/09/2015 for an acute medical condition and is being Discharged on  04/11/2015 . She will need 1-2 days for recovery and so advised to stay away from work until then. So please excuse her from school for above Days. Should be able to return to work/school without any restrictions from 04/14/15.  Call Enid Baas  MD, [facility] at  [phone] with questions.  Enid Baas M.D on 04/11/2015,at 10:26 AM  [facility] [address] [place] [address]

## 2015-08-30 ENCOUNTER — Encounter: Payer: Self-pay | Admitting: Emergency Medicine

## 2015-08-30 ENCOUNTER — Emergency Department
Admission: EM | Admit: 2015-08-30 | Discharge: 2015-08-30 | Disposition: A | Discharge status: Home / Self Care | Payer: BLUE CROSS/BLUE SHIELD | Attending: Emergency Medicine | Admitting: Emergency Medicine

## 2015-08-30 ENCOUNTER — Emergency Department: Payer: BLUE CROSS/BLUE SHIELD

## 2015-08-30 DIAGNOSIS — J029 Acute pharyngitis, unspecified: Secondary | ICD-10-CM | POA: Diagnosis not present

## 2015-08-30 DIAGNOSIS — R079 Chest pain, unspecified: Secondary | ICD-10-CM

## 2015-08-30 DIAGNOSIS — Z79899 Other long term (current) drug therapy: Secondary | ICD-10-CM | POA: Diagnosis not present

## 2015-08-30 DIAGNOSIS — J45901 Unspecified asthma with (acute) exacerbation: Secondary | ICD-10-CM | POA: Insufficient documentation

## 2015-08-30 DIAGNOSIS — Z7952 Long term (current) use of systemic steroids: Secondary | ICD-10-CM | POA: Diagnosis not present

## 2015-08-30 DIAGNOSIS — F419 Anxiety disorder, unspecified: Secondary | ICD-10-CM | POA: Insufficient documentation

## 2015-08-30 DIAGNOSIS — R0602 Shortness of breath: Secondary | ICD-10-CM

## 2015-08-30 LAB — CBC
HCT: 37.1 % (ref 35.0–47.0)
Hemoglobin: 12 g/dL (ref 12.0–16.0)
MCH: 25.4 pg — ABNORMAL LOW (ref 26.0–34.0)
MCHC: 32.4 g/dL (ref 32.0–36.0)
MCV: 78.5 fL — ABNORMAL LOW (ref 80.0–100.0)
PLATELETS: 266 10*3/uL (ref 150–440)
RBC: 4.73 MIL/uL (ref 3.80–5.20)
RDW: 15.4 % — AB (ref 11.5–14.5)
WBC: 10.2 10*3/uL (ref 3.6–11.0)

## 2015-08-30 LAB — BASIC METABOLIC PANEL
ANION GAP: 8 (ref 5–15)
BUN: 12 mg/dL (ref 6–20)
CALCIUM: 8.8 mg/dL — AB (ref 8.9–10.3)
CO2: 25 mmol/L (ref 22–32)
CREATININE: 0.72 mg/dL (ref 0.44–1.00)
Chloride: 103 mmol/L (ref 101–111)
GFR calc non Af Amer: >60 mL/min (ref >60)
Glucose, Bld: 94 mg/dL (ref 65–99)
Potassium: 3.2 mmol/L — ABNORMAL LOW (ref 3.5–5.1)
SODIUM: 136 mmol/L (ref 135–145)

## 2015-08-30 LAB — TROPONIN I

## 2015-08-30 LAB — FIBRIN DERIVATIVES D-DIMER (ARMC ONLY): Fibrin derivatives D-dimer (ARMC): 960 — ABNORMAL HIGH (ref 0–499)

## 2015-08-30 MED ORDER — HYDROMORPHONE HCL 1 MG/ML IJ SOLN
0.5000 mg | Freq: Once | INTRAMUSCULAR | Status: AC
  Administered 2015-08-30: 0.5 mg via INTRAVENOUS
  Filled 2015-08-30: qty 1

## 2015-08-30 MED ORDER — OXYCODONE-ACETAMINOPHEN 5-325 MG PO TABS: 1.0000 | ORAL_TABLET | Freq: Four times a day (QID) | ORAL | Status: DC | PRN

## 2015-08-30 MED ORDER — ONDANSETRON HCL 4 MG/2ML IJ SOLN
INTRAMUSCULAR | Status: AC
  Filled 2015-08-30: qty 2

## 2015-08-30 MED ORDER — TECHNETIUM TO 99M ALBUMIN AGGREGATED
4.3160 | Freq: Once | INTRAVENOUS | Status: AC | PRN
  Administered 2015-08-30: 4.136 via INTRAVENOUS

## 2015-08-30 MED ORDER — TECHNETIUM TC 99M DIETHYLENETRIAME-PENTAACETIC ACID
29.4160 | Freq: Once | INTRAVENOUS | Status: AC | PRN
  Administered 2015-08-30: 29.416 via INTRAVENOUS

## 2015-08-30 MED ORDER — ONDANSETRON HCL 4 MG/2ML IJ SOLN
4.0000 mg | Freq: Once | INTRAMUSCULAR | Status: AC
Start: 2015-08-30 — End: 2015-08-30
  Administered 2015-08-30: 4 mg via INTRAVENOUS

## 2015-08-30 MED ORDER — FENTANYL CITRATE (PF) 100 MCG/2ML IJ SOLN
50.0000 ug | Freq: Once | INTRAMUSCULAR | Status: AC
  Administered 2015-08-30: 50 ug via INTRAVENOUS
  Filled 2015-08-30: qty 2

## 2015-08-30 MED ORDER — LORAZEPAM 0.5 MG PO TABS
0.5000 mg | ORAL_TABLET | Freq: Once | ORAL | Status: AC
  Administered 2015-08-30: 0.5 mg via ORAL
  Filled 2015-08-30: qty 1

## 2015-08-30 NOTE — Discharge Instructions (Signed)
Please return to the emergency department for worsening chest pain, shortness of breath, fainting, palpitations, fever, or any other symptoms concerning to you.

## 2015-08-30 NOTE — ED Notes (Signed)
Pt transported to VQ scan at this time, pt stable, no acute distress noted at this time

## 2015-08-30 NOTE — ED Provider Notes (Signed)
[facility] Emergency Department Provider Note  ____________________________________________  Time seen: Approximately 5:44 PM  I have reviewed the triage vital signs and the nursing notes.   HISTORY  Chief Complaint Chest Pain    HPI Shannon FOUNTAINE is a 50 y.o. female a history of antiphospholipid syndrome, asthma presenting with chest pain. Shannon Poole reports that she has been feeling unwell for approximately 1 week and "I thought I was getting the flu."  She was having some mild sore throat, decreased energy.Today, she developed a right-sided chest pressure "like an elephant sitting on my chest." It is worse with deep breaths and is associated with shortness of breath. She has some mild nausea without vomiting, no fever, cough, chills. He denies OCP use, tobacco abuse, cocaine use, recent car trips. She denies any chest pain or worsening of pain with positional changes.  Patient reports stress test and cardiac catheterization greater than 8 years ago.  SH: Denies tobacco, cocaine use  FH: Positive for CAD but not at early ages; mother's heart history is unknown.   Past Medical History  Diagnosis Date  . Antiphospholipid syndrome (HCC)   . Asthma     Patient Active Problem List   Diagnosis Date Noted  . Musculoskeletal chest pain 04/11/2015  . Chest pain 04/09/2015    Past Surgical History  Procedure Laterality Date  . Cholecystectomy    . Cesarean section N/A     Current Outpatient Rx  Name  Route  Sig  Dispense  Refill  . cyclobenzaprine (FLEXERIL) 10 MG tablet   Oral   Take 10 mg by mouth daily as needed for muscle spasms.         Marland Kitchen levothyroxine (SYNTHROID, LEVOTHROID) 50 MCG tablet   Oral   Take 50 mcg by mouth daily before breakfast.         . oxyCODONE-acetaminophen (PERCOCET/ROXICET) 5-325 MG tablet   Oral   Take 1 tablet by mouth every 6 (six) hours as needed for moderate pain.   12 tablet   0   . predniSONE (STERAPRED  UNI-PAK 21 TAB) 10 MG (21) TBPK tablet   Oral   Take 1 tablet (10 mg total) by mouth daily. 6 tabs PO x 1 day 5 tabs PO x 1 day 4 tabs PO x 1 day 3 tabs PO x 1 day 2 tabs PO x 1 day 1 tab PO x 1 day and stop   21 tablet   0     Allergies Contrast media; Codeine; Macrolides and ketolides; and Macrobid  Family History  Problem Relation Age of Onset  . Diabetes Mother   . Leukemia Mother   . Congestive Heart Failure Father   . Cancer Other     Social History Social History  Substance Use Topics  . Smoking status: Never Smoker   . Smokeless tobacco: None  . Alcohol Use: Yes    Review of Systems Constitutional: No fever/chills. No syncopal or lightheadedness. Eyes: No visual changes. ENT: Mild sore throat. Cardiovascular: Positive chest pain, negative palpitations. Respiratory: Positive shortness of breath.  No cough. Gastrointestinal: No abdominal pain.  No nausea, no vomiting.  No diarrhea.  No constipation. Genitourinary: Negative for dysuria. Musculoskeletal: Negative for back pain. Skin: Negative for rash. Neurological: Negative for headaches, focal weakness or numbness.  10-point ROS otherwise negative.  ____________________________________________   PHYSICAL EXAM:  VITAL SIGNS: ED Triage Vitals  Enc Vitals Group     BP 08/30/15 1734 113/62 mmHg  Pulse Rate 08/30/15 1734 94     Resp 08/30/15 1734 19     Temp 08/30/15 1734 98.1 F (36.7 C)     Temp src --      SpO2 08/30/15 1734 100 %     Weight --      Height --      Head Cir --      Peak Flow --      Pain Score --      Pain Loc --      Pain Edu? --      Excl. in GC? --     Constitutional: Alert and oriented. Well appearing and in no acute distress. Anxious but able to Answer question appropriately. Eyes: Conjunctivae are normal.  EOMI. no scleral icterus. No discharge from the eyes. Head: Atraumatic. Nose: No congestion/rhinnorhea. Mouth/Throat: Mucous membranes are moist.  Neck: No  stridor.  Supple.  No JVD. Cardiovascular: Normal rate, regular rhythm. No murmurs, rubs or gallops.  Respiratory: Normal respiratory effort but pain is worse with deep breaths.  No retractions. Lungs CTAB.  No wheezes, rales or ronchi. Gastrointestinal: Soft and nontender. No distention. No peritoneal signs. Musculoskeletal: No LE edema. No palpable cords, Homans sign, or tenderness to palpation over the calves. Neurologic:  Normal speech and language. No gross focal neurologic deficits are appreciated.  Skin:  Skin is warm, dry and intact. No rash noted. Psychiatric: Mood anxious and affect is normal. Speech and behavior are normal.  Normal judgement.  ____________________________________________   LABS (all labs ordered are listed, but only abnormal results are displayed)  Labs Reviewed  CBC - Abnormal; Notable for the following:    MCV 78.5 (*)    MCH 25.4 (*)    RDW 15.4 (*)    All other components within normal limits  BASIC METABOLIC PANEL - Abnormal; Notable for the following:    Potassium 3.2 (*)    Calcium 8.8 (*)    All other components within normal limits  FIBRIN DERIVATIVES D-DIMER ([facility] ONLY) - Abnormal; Notable for the following:    Fibrin derivatives D-dimer ([facility]) 960 (*)    All other components within normal limits  TROPONIN I   ____________________________________________  EKG  ED ECG REPORT I, Rockne Menghini, the attending physician, personally viewed and interpreted this ECG.   Date: 08/30/2015  EKG Time: 1734   Rate: 100   Rhythm: normal sinus rhythm  Axis: normal  Intervals:none  ST&T Change: no ST elevation  ____________________________________________  RADIOLOGY  Dg Chest 2 View  08/30/2015  CLINICAL DATA:  Right-sided chest pain. Previously high D-dimer and blood clots during pregnancy. Concern for recurrent blood clot. EXAM: CHEST  2 VIEW COMPARISON:  Chest x-ray dated 04/09/2015. FINDINGS: Heart size is normal. Overall  cardiomediastinal silhouette remains normal in size and configuration. Lungs are clear. Lung volumes are stable. No pleural effusions seen. No pneumothorax seen. Minimal degenerative change again noted within the upper thoracic spine. Osseous and soft tissue structures about the chest are otherwise unremarkable. IMPRESSION: Stable chest x-ray. Lungs are clear and there is no evidence of acute cardiopulmonary abnormality. Electronically Signed   By: Bary Richard M.D.   On: 08/30/2015 18:05    ____________________________________________   PROCEDURES  Procedure(s) performed: None  Critical Care performed: No ____________________________________________   INITIAL IMPRESSION / ASSESSMENT AND PLAN / ED COURSE  Pertinent labs & imaging results that were available during my care of the patient were reviewed by me and considered in my medical decision  making (see chart for details).  50 y.o. female with a history of antiphospholipid syndrome presenting with right-sided chest pain that is worse with deep breaths.  Patient is anxious and mildly uncomfortable appearing but nontoxic. Her vital signs are stable and her exam is reassuring. Given her antiphospholipid syndrome, she is at risk for PE. She is not tachycardic, hypoxic, and she has no evidence of DVT, nor does she take exogenous estrogen or have any recent travel history. However, she is having chest pain that is worse with deep breaths. I will start with a d-dimer, and order a VQ study if it is positive. She is allergic to IV contrast dye and is refusing prep prior to CT. I will also get a troponin to evaluate for ACS or MI, and they would consider pain associated with anxiety, panic attacks, esophageal spasm, GERD.  9:58 PM  Shannon Poole d-dimer is positive which is likely at least in part due to her antiphospholipid syndrome. However, her antiphospholipid syndrome does increase her risk of PE and the patient is continuing to have pain although it is  slightly improved after the first dose of pain medications. I have called the V/Q scan tech to come in to perform V/Q scan for this patient. Cardiac workup at this time is reassuring for no ischemic changes on her EKG and negative troponin.  ----------------------------------------- 9:58 PM on 08/30/2015 -----------------------------------------  Patient's pain has slightly improved and her VQ scan has been performed as and I am awaiting the results. If the workup is negative, I'll plan discharge home. ____________________________________________  FINAL CLINICAL IMPRESSION(S) / ED DIAGNOSES  Final diagnoses:  Chest pain  Shortness of breath  Anxiety  Sore throat      NEW MEDICATIONS STARTED DURING THIS VISIT:  New Prescriptions   No medications on file     Rockne MenghiniAnne-Caroline Mathis Cashman, MD 08/30/15 2158

## 2015-08-30 NOTE — ED Notes (Signed)
Patient transported to X-ray 

## 2015-08-30 NOTE — ED Notes (Signed)
Pt had a previously high d-dimer, and blood clots during pregnancy and is afraid she has a blood clot.

## 2015-08-30 NOTE — ED Notes (Signed)
Pt c/o nausea after Dilaudid; zofran given as ordered by MD

## 2019-05-09 ENCOUNTER — Ambulatory Visit
Admission: RE | Admit: 2019-05-09 | Discharge: 2019-05-09 | Disposition: A | Discharge status: Home / Self Care | Payer: Self-pay | Attending: Family | Admitting: Family

## 2019-05-09 ENCOUNTER — Other Ambulatory Visit: Payer: Self-pay | Admitting: Family

## 2019-05-09 ENCOUNTER — Ambulatory Visit
Admission: RE | Admit: 2019-05-09 | Discharge: 2019-05-09 | Disposition: A | Discharge status: Home / Self Care | Payer: Self-pay | Source: Ambulatory Visit | Attending: Family | Admitting: Family

## 2019-05-09 DIAGNOSIS — M5412 Radiculopathy, cervical region: Secondary | ICD-10-CM

## 2020-06-30 ENCOUNTER — Other Ambulatory Visit: Payer: Self-pay | Admitting: Physician Assistant

## 2020-06-30 DIAGNOSIS — Z1231 Encounter for screening mammogram for malignant neoplasm of breast: Secondary | ICD-10-CM

## 2020-12-04 ENCOUNTER — Emergency Department: Payer: Medicaid Other

## 2020-12-04 ENCOUNTER — Other Ambulatory Visit: Payer: Self-pay

## 2020-12-04 ENCOUNTER — Encounter: Payer: Self-pay | Admitting: Emergency Medicine

## 2020-12-04 ENCOUNTER — Emergency Department
Admission: EM | Admit: 2020-12-04 | Discharge: 2020-12-04 | Disposition: A | Discharge status: Home / Self Care | Payer: Medicaid Other | Attending: Physician Assistant | Admitting: Physician Assistant

## 2020-12-04 DIAGNOSIS — X58XXXA Exposure to other specified factors, initial encounter: Secondary | ICD-10-CM | POA: Diagnosis not present

## 2020-12-04 DIAGNOSIS — S3991XA Unspecified injury of abdomen, initial encounter: Secondary | ICD-10-CM | POA: Diagnosis present

## 2020-12-04 DIAGNOSIS — S39001A Unspecified injury of muscle, fascia and tendon of abdomen, initial encounter: Secondary | ICD-10-CM | POA: Diagnosis not present

## 2020-12-04 DIAGNOSIS — S76211A Strain of adductor muscle, fascia and tendon of right thigh, initial encounter: Secondary | ICD-10-CM

## 2020-12-04 DIAGNOSIS — J45909 Unspecified asthma, uncomplicated: Secondary | ICD-10-CM | POA: Insufficient documentation

## 2020-12-04 DIAGNOSIS — R102 Pelvic and perineal pain: Secondary | ICD-10-CM

## 2020-12-04 LAB — URINALYSIS, COMPLETE (UACMP) WITH MICROSCOPIC
Bilirubin Urine: NEGATIVE
Glucose, UA: NEGATIVE mg/dL
Hgb urine dipstick: NEGATIVE
Ketones, ur: NEGATIVE mg/dL
Nitrite: NEGATIVE
Protein, ur: 100 mg/dL — AB
Specific Gravity, Urine: 1.014 (ref 1.005–1.030)
Squamous Epithelial / HPF: >50 — ABNORMAL HIGH (ref 0–5)
WBC, UA: >50 WBC/hpf — ABNORMAL HIGH (ref 0–5)
pH: 9 — ABNORMAL HIGH (ref 5.0–8.0)

## 2020-12-04 LAB — POC URINE PREG, ED: Preg Test, Ur: NEGATIVE

## 2020-12-04 MED ORDER — LIDOCAINE 5 % EX PTCH
1.0000 | MEDICATED_PATCH | CUTANEOUS | Status: DC
  Administered 2020-12-04: 1 via TRANSDERMAL
  Filled 2020-12-04: qty 1

## 2020-12-04 MED ORDER — OXYCODONE-ACETAMINOPHEN 5-325 MG PO TABS
1.0000 | ORAL_TABLET | Freq: Once | ORAL | Status: AC
  Administered 2020-12-04: 1 via ORAL
  Filled 2020-12-04: qty 1

## 2020-12-04 MED ORDER — OXYCODONE-ACETAMINOPHEN 7.5-325 MG PO TABS: 1.0000 | ORAL_TABLET | Freq: Four times a day (QID) | ORAL | 0 refills | Status: AC | PRN

## 2020-12-04 MED ORDER — ORPHENADRINE CITRATE 30 MG/ML IJ SOLN
60.0000 mg | Freq: Two times a day (BID) | INTRAMUSCULAR | Status: DC
  Administered 2020-12-04: 60 mg via INTRAMUSCULAR
  Filled 2020-12-04: qty 2

## 2020-12-04 MED ORDER — LIDOCAINE 5 % EX PTCH: 1.0000 | MEDICATED_PATCH | Freq: Two times a day (BID) | CUTANEOUS | 0 refills | Status: AC

## 2020-12-04 MED ORDER — KETOROLAC TROMETHAMINE 10 MG PO TABS: 10.0000 mg | ORAL_TABLET | Freq: Four times a day (QID) | ORAL | 0 refills | Status: AC | PRN

## 2020-12-04 MED ORDER — HYDROMORPHONE HCL 1 MG/ML IJ SOLN
1.0000 mg | Freq: Once | INTRAMUSCULAR | Status: AC
  Administered 2020-12-04: 1 mg via INTRAMUSCULAR
  Filled 2020-12-04: qty 1

## 2020-12-04 NOTE — ED Provider Notes (Signed)
[facility] Emergency Department Provider Note   ____________________________________________   Event Date/Time   First MD Initiated Contact with Patient 12/04/20 [phone]     (approximate)  I have reviewed the triage vital signs and the nursing notes.   HISTORY  Chief Complaint Groin Pain    HPI Shannon Poole is a 55 y.o. female patient arrived via EMS from home complaining of left groin pain that radiates into her leg.  Patient denies any provocative incident for complaint.  Pain started last night after she went to bed.  Patient denies any history of hernia.  Patient denies any bladder or bowel dysfunction.  Patient denies chest pain or dyspnea.  Rates pain as a 10/10.  Described pain as "achy".  No palliative measure for complaint.         Past Medical History:  Diagnosis Date  . Antiphospholipid syndrome (HCC)   . Asthma     Patient Active Problem List   Diagnosis Date Noted  . Musculoskeletal chest pain 04/11/2015  . Chest pain 04/09/2015    Past Surgical History:  Procedure Laterality Date  . CESAREAN SECTION N/A   . CHOLECYSTECTOMY      Prior to Admission medications   Medication Sig Start Date End Date Taking? Authorizing Provider  cyclobenzaprine (FLEXERIL) 5 MG tablet Take 5 mg by mouth 3 (three) times daily as needed for muscle spasms.   Yes [provider]  DULoxetine (CYMBALTA) 60 MG capsule Take 60 mg by mouth daily.   Yes [provider]  ketorolac (TORADOL) 10 MG tablet Take 1 tablet (10 mg total) by mouth every 6 (six) hours as needed. 12/04/20  Yes Joni Reining, PA-C  lidocaine (LIDODERM) 5 % Place 1 patch onto the skin every 12 (twelve) hours. Remove & Discard patch within 12 hours or as directed by MD 12/04/20 12/04/21 Yes Joni Reining, PA-C  meloxicam (MOBIC) 7.5 MG tablet Take 7.5 mg by mouth daily.   Yes [provider]  metoprolol succinate (TOPROL-XL) 25 MG 24 hr tablet Take 25 mg by  mouth daily.   Yes [provider]  oxyCODONE-acetaminophen (PERCOCET) 7.5-325 MG tablet Take 1 tablet by mouth every 6 (six) hours as needed for up to 5 days. 12/04/20 12/09/20 Yes Joni Reining, PA-C  pregabalin (LYRICA) 50 MG capsule Take 50 mg by mouth 3 (three) times daily.   Yes [provider]  levothyroxine (SYNTHROID, LEVOTHROID) 50 MCG tablet Take 50 mcg by mouth daily before breakfast.    [provider]    Allergies Contrast media [iodinated diagnostic agents], Codeine, Macrolides and ketolides, and Macrobid [nitrofurantoin monohyd macro]  Family History  Problem Relation Age of Onset  . Diabetes Mother   . Leukemia Mother   . Congestive Heart Failure Father   . Cancer Other     Social History Social History   Tobacco Use  . Smoking status: Never Smoker  . Smokeless tobacco: Never Used  Substance Use Topics  . Alcohol use: Yes  . Drug use: No    Review of Systems Constitutional: No fever/chills Eyes: No visual changes. ENT: No sore throat. Cardiovascular: Denies chest pain. Respiratory: Denies shortness of breath. Gastrointestinal: No abdominal pain.  No nausea, no vomiting.  No diarrhea.  No constipation. Genitourinary: Negative for dysuria. Musculoskeletal: Left inguinal pain. Skin: Negative for rash. Neurological: Negative for headaches, focal weakness or numbness. Endocrine:  Hypertension hypothyroidism. Allergic/Immunilogical: Contrast dye, codeine, and Macrobid. ____________________________________________   PHYSICAL EXAM:  VITAL SIGNS: ED Triage Vitals  Enc Vitals Group     BP 12/04/20 0852 (!) 137/92     Pulse Rate 12/04/20 0852 (!) 109     Resp 12/04/20 0852 20     Temp 12/04/20 0852 98.7 F (37.1 C)     Temp Source 12/04/20 0852 Oral     SpO2 12/04/20 0852 99 %     Weight 12/04/20 0851 180 lb (81.6 kg)     Height 12/04/20 0851 5\' 4"  (1.626 m)     Head Circumference --      Peak Flow --      Pain Score  12/04/20 0850 10     Pain Loc --      Pain Edu? --      Excl. in GC? --     Constitutional: Alert and oriented.  Moderate distress.   Cardiovascular: Normal rate, regular rhythm. Grossly normal heart sounds.  Good peripheral circulation. Respiratory: Normal respiratory effort.  No retractions. Lungs CTAB. Gastrointestinal: Soft and nontender. No distention. No abdominal bruits. No CVA tenderness. Genitourinary: Deferred Musculoskeletal: Left ankle guarding with palpation.  No visible or palpable mass.  No lower extremity tenderness nor edema.  No joint effusions. Neurologic:  Normal speech and language. No gross focal neurologic deficits are appreciated. No gait instability. Skin:  Skin is warm, dry and intact. No rash noted. Psychiatric: Mood and affect are normal. Speech and behavior are normal.  ____________________________________________   LABS (all labs ordered are listed, but only abnormal results are displayed)  Labs Reviewed  URINALYSIS, COMPLETE (UACMP) WITH MICROSCOPIC - Abnormal; Notable for the following components:      Result Value   Color, Urine AMBER (*)    APPearance TURBID (*)    pH 9.0 (*)    Protein, ur 100 (*)    Leukocytes,Ua LARGE (*)    WBC, UA >50 (*)    Bacteria, UA FEW (*)    Squamous Epithelial / LPF >50 (*)    All other components within normal limits  PREGNANCY, URINE  POC URINE PREG, ED   ____________________________________________  EKG   ____________________________________________  RADIOLOGY I, 12/06/20, personally viewed and evaluated these images (plain radiographs) as part of my medical decision making, as well as reviewing the written report by the radiologist.  ED MD interpretation:    Official radiology report(s): Joni Reining Venous Img Lower Unilateral Left  Result Date: 12/04/2020 CLINICAL DATA:  Left groin pain since last evening. Evaluate for DVT. EXAM: LEFT LOWER EXTREMITY VENOUS DOPPLER ULTRASOUND TECHNIQUE: Gray-scale  sonography with graded compression, as well as color Doppler and duplex ultrasound were performed to evaluate the lower extremity deep venous systems from the level of the common femoral vein and including the common femoral, femoral, profunda femoral, popliteal and calf veins including the posterior tibial, peroneal and gastrocnemius veins when visible. The superficial great saphenous vein was also interrogated. Spectral Doppler was utilized to evaluate flow at rest and with distal augmentation maneuvers in the common femoral, femoral and popliteal veins. COMPARISON:  Bilateral lower extremity venous Doppler ultrasound-04/10/2015 (negative) FINDINGS: Contralateral Common Femoral Vein: Respiratory phasicity is normal and symmetric with the symptomatic side. No evidence of thrombus. Normal compressibility. Common Femoral Vein: No evidence of thrombus. Normal compressibility, respiratory phasicity and response to augmentation. Saphenofemoral Junction: No evidence of thrombus. Normal compressibility and flow on color Doppler imaging. Profunda Femoral Vein: No evidence of thrombus. Normal compressibility and flow on color Doppler imaging. Femoral Vein: No evidence of thrombus.  Normal compressibility, respiratory phasicity and response to augmentation. Popliteal Vein: No evidence of thrombus. Normal compressibility, respiratory phasicity and response to augmentation. Calf Veins: No evidence of thrombus. Normal compressibility and flow on color Doppler imaging. Superficial Great Saphenous Vein: No evidence of thrombus. Normal compressibility. Venous Reflux:  None. Other Findings:  None. IMPRESSION: No evidence of DVT within the left lower extremity. Electronically Signed   By: Simonne ComeJohn  Watts M.D.   On: 12/04/2020 10:10   CT Renal Stone Study  Result Date: 12/04/2020 CLINICAL DATA:  Left groin pain, hematuria EXAM: CT ABDOMEN AND PELVIS WITHOUT CONTRAST TECHNIQUE: Multidetector CT imaging of the abdomen and pelvis was  performed following the standard protocol without IV contrast. COMPARISON:  None. FINDINGS: Lower chest: Lung bases are clear. No effusions. Heart is normal size. Hepatobiliary: No focal liver abnormality is seen. Status post cholecystectomy. No biliary dilatation. Pancreas: No focal abnormality or ductal dilatation. Spleen: No focal abnormality.  Normal size. Adrenals/Urinary Tract: No adrenal abnormality. No focal renal abnormality. No stones or hydronephrosis. Urinary bladder is unremarkable. Stomach/Bowel: Normal appendix. Moderate stool burden. Stomach, large and small bowel grossly unremarkable. Vascular/Lymphatic: No evidence of aneurysm or adenopathy. Reproductive: Small exophytic fibroid off the right anterior uterine fundus measures 1.8 cm. No adnexal mass. Other: No free fluid or free air. Musculoskeletal: No acute bony abnormality. IMPRESSION: No renal or ureteral stones.  No hydronephrosis. Fibroid uterus. Moderate stool throughout the colon. No acute findings. Electronically Signed   By: Charlett NoseKevin  Dover M.D.   On: 12/04/2020 11:33   US PELVIC COMPLETE WITH TRANSVAGINAL  Result Date: 12/04/2020 CLINICAL DATA:  Pelvic pain on the left for 1 day EXAM: TRANSABDOMINAL AND TRANSVAGINAL ULTRASOUND OF PELVIS TECHNIQUE: Both transabdominal and transvaginal ultrasound examinations of the pelvis were performed. Transabdominal technique was performed for global imaging of the pelvis including uterus, ovaries, adnexal regions, and pelvic cul-de-sac. It was necessary to proceed with endovaginal exam following the transabdominal exam to visualize the uterus, endometrium, ovaries, and adnexa. COMPARISON:  None FINDINGS: Uterus Measurements: 8.3 x 3.6 x 4.3 cm = volume: 68 mL. Small subserosal fibroid of the right aspect of the uterine fundus measuring 2.4 cm. Endometrium Thickness: 4 mm.  No focal abnormality visualized. Right ovary Nonvisualized. Left ovary Nonvisualized. Other findings No abnormal free fluid.  IMPRESSION: 1. Nonvisualization of the bilateral ovaries due to bowel gas. Within this limitation, no ultrasound abnormality to explain pelvic pain. 2. Small subserosal fibroid of the right aspect of the uterine fundus measuring 2.4 cm. Electronically Signed   By: Lauralyn PrimesAlex  Bibbey M.D.   On: 12/04/2020 15:53    ____________________________________________   PROCEDURES  Procedure(s) performed (including Critical Care):  Procedures   ____________________________________________   INITIAL IMPRESSION / ASSESSMENT AND PLAN / ED COURSE  As part of my medical decision making, I reviewed the following data within the electronic MEDICAL RECORD NUMBER         Patient presents with left groin pain radiating to the leg.  Differentials consist of kidney stone, DVT,or ovarian cyst.  Discussed no acute findings with CT of the pelvic, ultrasound of the pelvic, and ultrasound of the left lower extremity.  Patient complaining physical exam is consistent with left inguinal strain.  Patient given discharge care instructions and advised take medication as directed.  Return back to ED if condition worsens.      ____________________________________________   FINAL CLINICAL IMPRESSION(S) / ED DIAGNOSES  Final diagnoses:  Inguinal strain, right, initial encounter     ED Discharge Orders  Ordered    oxyCODONE-acetaminophen (PERCOCET) 7.5-325 MG tablet  Every 6 hours PRN        12/04/20 1606    ketorolac (TORADOL) 10 MG tablet  Every 6 hours PRN        12/04/20 1606    lidocaine (LIDODERM) 5 %  Every 12 hours        12/04/20 1606          *Please note:  Merit Gadsby Jividen was evaluated in Emergency Department on 12/04/2020 for the symptoms described in the history of present illness. She was evaluated in the context of the global COVID-19 pandemic, which necessitated consideration that the patient might be at risk for infection with the SARS-CoV-2 virus that causes COVID-19. Institutional  protocols and algorithms that pertain to the evaluation of patients at risk for COVID-19 are in a state of rapid change based on information released by regulatory bodies including the CDC and federal and state organizations. These policies and algorithms were followed during the patient's care in the ED.  Some ED evaluations and interventions may be delayed as a result of limited staffing during and the pandemic.*   Note:  This document was prepared using Dragon voice recognition software and may include unintentional dictation errors.    Joni Reining, PA-C 12/04/20 1613    Willy Eddy, MD 12/05/20 [phone]

## 2020-12-04 NOTE — ED Notes (Signed)
Lab called to add urine preg on to existing specimen

## 2020-12-04 NOTE — ED Triage Notes (Signed)
Pt comes into the ED via [facility] from home c/o left groin pain that radiates into the leg.  Pt denies any injury and states the pain started last night.  Pt denies any known h/o inguinal hernia, denies any bulge, etc.  Pt in NAd with even and unlabored respirations.

## 2020-12-04 NOTE — Discharge Instructions (Addendum)
No acute findings on ultrasound of the left lower extremity or pelvic.  CT findings of the pelvic.  Read and follow discharge care instruction.  Take medication as directed.  Be advised on effects of pain medication which your history of falls.

## 2020-12-04 NOTE — ED Notes (Signed)
See triage note  Presents with left groin pain  States pain started last pm   Pain increases with movement and ambulation

## 2020-12-04 NOTE — ED Notes (Signed)
POC preg, Negative

## 2020-12-04 NOTE — ED Notes (Signed)
This RN received a phone call from [place] at [place] that pt had fell after using the restroom. [place] explained that pt came out to her after using restroom and states she slipped off the toilet. This RN was called to assess pt after fall. Pt denies any injuries at this time and states her knee "just gave out". There is no redness, swelling, or obvious bruising noted at this time. Pt denies numbness or tingling at this time.  Post fall safety zone filled out but [place] tech, charge RN aware.   This fall was not witnessed by this RN or [place] tech.   Yellow fall risk bracelet placed on pt, pt already had non slip slippers on, new fall risk assessment done.   PA Ron Katrinka Blazing aware of fall, no new orders at this time due to no injuries.  Pt denies taking blood thinners.

## 2021-02-22 ENCOUNTER — Other Ambulatory Visit: Payer: Self-pay

## 2021-02-22 ENCOUNTER — Emergency Department: Payer: Medicaid Other

## 2021-02-22 DIAGNOSIS — W01198A Fall on same level from slipping, tripping and stumbling with subsequent striking against other object, initial encounter: Secondary | ICD-10-CM | POA: Diagnosis not present

## 2021-02-22 DIAGNOSIS — S0990XA Unspecified injury of head, initial encounter: Secondary | ICD-10-CM | POA: Diagnosis not present

## 2021-02-22 DIAGNOSIS — Y92000 Kitchen of unspecified non-institutional (private) residence as  the place of occurrence of the external cause: Secondary | ICD-10-CM | POA: Diagnosis not present

## 2021-02-22 DIAGNOSIS — J45909 Unspecified asthma, uncomplicated: Secondary | ICD-10-CM | POA: Diagnosis not present

## 2021-02-22 LAB — CBC WITH DIFFERENTIAL/PLATELET
Abs Immature Granulocytes: 0.02 10*3/uL (ref 0.00–0.07)
Basophils Absolute: 0 10*3/uL (ref 0.0–0.1)
Basophils Relative: 1 %
Eosinophils Absolute: 0.5 10*3/uL (ref 0.0–0.5)
Eosinophils Relative: 7 %
HCT: 41.6 % (ref 36.0–46.0)
Hemoglobin: 14.6 g/dL (ref 12.0–15.0)
Immature Granulocytes: 0 %
Lymphocytes Relative: 38 %
Lymphs Abs: 2.7 10*3/uL (ref 0.7–4.0)
MCH: 30.8 pg (ref 26.0–34.0)
MCHC: 35.1 g/dL (ref 30.0–36.0)
MCV: 87.8 fL (ref 80.0–100.0)
Monocytes Absolute: 0.4 10*3/uL (ref 0.1–1.0)
Monocytes Relative: 5 %
Neutro Abs: 3.5 10*3/uL (ref 1.7–7.7)
Neutrophils Relative %: 49 %
Platelets: 282 10*3/uL (ref 150–400)
RBC: 4.74 MIL/uL (ref 3.87–5.11)
RDW: 12.3 % (ref 11.5–15.5)
WBC: 7.1 10*3/uL (ref 4.0–10.5)
nRBC: 0 % (ref 0.0–0.2)

## 2021-02-22 LAB — COMPREHENSIVE METABOLIC PANEL
ALT: 39 U/L (ref 0–44)
AST: 54 U/L — ABNORMAL HIGH (ref 15–41)
Albumin: 4.3 g/dL (ref 3.5–5.0)
Alkaline Phosphatase: 63 U/L (ref 38–126)
Anion gap: 10 (ref 5–15)
BUN: 13 mg/dL (ref 6–20)
CO2: 27 mmol/L (ref 22–32)
Calcium: 9.1 mg/dL (ref 8.9–10.3)
Chloride: 101 mmol/L (ref 98–111)
Creatinine, Ser: 0.75 mg/dL (ref 0.44–1.00)
GFR, Estimated: >60 mL/min (ref >60)
Glucose, Bld: 118 mg/dL — ABNORMAL HIGH (ref 70–99)
Potassium: 3.4 mmol/L — ABNORMAL LOW (ref 3.5–5.1)
Sodium: 138 mmol/L (ref 135–145)
Total Bilirubin: 0.5 mg/dL (ref 0.3–1.2)
Total Protein: 7.4 g/dL (ref 6.5–8.1)

## 2021-02-22 NOTE — ED Triage Notes (Signed)
Pt from home via ems, was unleashing a dog, lost balance, and hit head on base kitchen cabinet, to knees then floor. Reports 2 alcoholic beverages, vomiting x2. Bp 107/74, hr 101, bs 116. Denies blurred vision. Chronic neck pain unchanged.

## 2021-02-23 ENCOUNTER — Emergency Department
Admission: EM | Admit: 2021-02-23 | Discharge: 2021-02-23 | Disposition: A | Discharge status: Home / Self Care | Payer: Medicaid Other | Attending: Emergency Medicine | Admitting: Emergency Medicine

## 2021-02-23 DIAGNOSIS — W19XXXA Unspecified fall, initial encounter: Secondary | ICD-10-CM

## 2021-02-23 DIAGNOSIS — S0990XA Unspecified injury of head, initial encounter: Secondary | ICD-10-CM

## 2021-02-23 MED ORDER — OXYCODONE-ACETAMINOPHEN 5-325 MG PO TABS
1.0000 | ORAL_TABLET | Freq: Once | ORAL | Status: AC
  Administered 2021-02-23: 1 via ORAL
  Filled 2021-02-23: qty 1

## 2021-02-23 MED ORDER — IBUPROFEN 400 MG PO TABS
600.0000 mg | ORAL_TABLET | Freq: Once | ORAL | Status: AC
  Administered 2021-02-23: 600 mg via ORAL
  Filled 2021-02-23: qty 2

## 2021-02-23 NOTE — ED Provider Notes (Signed)
[facility] Emergency Department Provider Note  ____________________________________________  Time seen: Approximately 2:24 AM  I have reviewed the triage vital signs and the nursing notes.   HISTORY  Chief Complaint Fall   HPI Shannon Poole is a 55 y.o. female with a history of antiphospholipid syndrome and asthma who presents for evaluation of a fall.  Patient reports that she had a couple of drinks this evening.  She was then trying to unleashed her dog when she lost her balance and fell hitting her head on the base of the kitchen cabinet.  No LOC.  Patient vomited twice after that.  She is complaining of diffuse throbbing headache since that happened.  Denies any changes in vision.  She has a history of chronic neck and back pain but denies any changes on those.  Denies any chest pain, abdominal pain or extremity pain.  She reports that her fall was mechanical in nature.  Past Medical History:  Diagnosis Date   Antiphospholipid syndrome [facility])    Asthma     Patient Active Problem List   Diagnosis Date Noted   Musculoskeletal chest pain 04/11/2015   Chest pain 04/09/2015    Past Surgical History:  Procedure Laterality Date   CESAREAN SECTION N/A    CHOLECYSTECTOMY      Prior to Admission medications   Medication Sig Start Date End Date Taking? Authorizing Provider  cyclobenzaprine (FLEXERIL) 5 MG tablet Take 5 mg by mouth 3 (three) times daily as needed for muscle spasms.    [provider]  DULoxetine (CYMBALTA) 60 MG capsule Take 60 mg by mouth daily.    [provider]  ketorolac (TORADOL) 10 MG tablet Take 1 tablet (10 mg total) by mouth every 6 (six) hours as needed. 12/04/20   Joni Reining, PA-C  levothyroxine (SYNTHROID, LEVOTHROID) 50 MCG tablet Take 50 mcg by mouth daily before breakfast.    [provider]  lidocaine (LIDODERM) 5 % Place 1 patch onto the skin every 12 (twelve) hours. Remove & Discard patch  within 12 hours or as directed by MD 12/04/20 12/04/21  Joni Reining, PA-C  meloxicam (MOBIC) 7.5 MG tablet Take 7.5 mg by mouth daily.    [provider]  metoprolol succinate (TOPROL-XL) 25 MG 24 hr tablet Take 25 mg by mouth daily.    [provider]  pregabalin (LYRICA) 50 MG capsule Take 50 mg by mouth 3 (three) times daily.    [provider]    Allergies Contrast media [iodinated diagnostic agents], Codeine, Macrolides and ketolides, and Macrobid [nitrofurantoin monohyd macro]  Family History  Problem Relation Age of Onset   Diabetes Mother    Leukemia Mother    Congestive Heart Failure Father    Cancer Other     Social History Social History   Tobacco Use   Smoking status: Never   Smokeless tobacco: Never  Substance Use Topics   Alcohol use: Yes   Drug use: No    Review of Systems  Constitutional: Negative for fever. Eyes: Negative for visual changes. ENT: Negative for facial injury or neck injury Cardiovascular: Negative for chest injury. Respiratory: Negative for shortness of breath. Negative for chest wall injury. Gastrointestinal: Negative for abdominal pain or injury. Genitourinary: Negative for dysuria. Musculoskeletal: Negative for back injury, negative for arm or leg pain. Skin: Negative for laceration/abrasions. Neurological: + head injury.   ____________________________________________   PHYSICAL EXAM:  VITAL SIGNS: ED Triage Vitals  Enc Vitals Group  BP 02/22/21 2258 115/87     Pulse Rate 02/22/21 2258 (!) 104     Resp 02/22/21 2258 16     Temp 02/22/21 2258 98 F (36.7 C)     Temp Source 02/22/21 2258 Oral     SpO2 02/22/21 2258 94 %     Weight 02/22/21 2259 179 lb 14.3 oz (81.6 kg)     Height 02/22/21 2259 5\' 4"  (1.626 m)     Head Circumference --      Peak Flow --      Pain Score 02/22/21 2258 0     Pain Loc --      Pain Edu? --      Excl. in GC? --     Full spinal precautions maintained throughout  the trauma exam. Constitutional: Alert and oriented. No acute distress. Does not appear intoxicated. HEENT Head: Normocephalic and atraumatic. Face: No facial bony tenderness. Stable midface Ears: No hemotympanum bilaterally. No Battle sign Eyes: No eye injury. PERRL. No raccoon eyes Nose: Nontender. No epistaxis. No rhinorrhea Mouth/Throat: Mucous membranes are moist. No oropharyngeal blood. No dental injury. Airway patent without stridor. Normal voice. Neck: no C-collar. No midline c-spine tenderness.  Cardiovascular: Normal rate, regular rhythm. Normal and symmetric distal pulses are present in all extremities. Pulmonary/Chest: Chest wall is stable and nontender to palpation/compression. Normal respiratory effort. Breath sounds are normal. No crepitus.  Abdominal: Soft, nontender, non distended. Musculoskeletal: Nontender with normal full range of motion in all extremities. No deformities. No thoracic or lumbar midline spinal tenderness. Pelvis is stable. Skin: Skin is warm, dry and intact. No abrasions or contutions. Psychiatric: Speech and behavior are appropriate. Neurological: Normal speech and language. Moves all extremities to command. No gross focal neurologic deficits are appreciated.  Glascow Coma Score: 4 - Opens eyes on own 6 - Follows simple motor commands 5 - Alert and oriented GCS: 15   ____________________________________________   LABS (all labs ordered are listed, but only abnormal results are displayed)  Labs Reviewed  COMPREHENSIVE METABOLIC PANEL - Abnormal; Notable for the following components:      Result Value   Potassium 3.4 (*)    Glucose, Bld 118 (*)    AST 54 (*)    All other components within normal limits  CBC WITH DIFFERENTIAL/PLATELET   ____________________________________________  EKG  none  ____________________________________________  RADIOLOGY  I have personally reviewed the images performed during this visit and I agree with the  Radiologist's read.   Interpretation by Radiologist:  CT Head Wo Contrast  Result Date: 02/23/2021 CLINICAL DATA:  Head trauma after a fall.  Vomiting. EXAM: CT HEAD WITHOUT CONTRAST TECHNIQUE: Contiguous axial images were obtained from the base of the skull through the vertex without intravenous contrast. COMPARISON:  11/08/2014 FINDINGS: Brain: No evidence of acute infarction, hemorrhage, hydrocephalus, extra-axial collection or mass lesion/mass effect. Vascular: No hyperdense vessel or unexpected calcification. Skull: Calvarium appears intact. Sinuses/Orbits: Paranasal sinuses and mastoid air cells are clear. Other: No significant change since previous study. IMPRESSION: No acute intracranial abnormalities. Electronically Signed   By: 11/10/2014 M.D.   On: 02/23/2021 00:06     ____________________________________________   PROCEDURES  Procedure(s) performed: None Procedures Critical Care performed:  None ____________________________________________   INITIAL IMPRESSION / ASSESSMENT AND PLAN / ED COURSE  55 y.o. female with a history of antiphospholipid syndrome and asthma who presents for evaluation of a fall.  Patient with a mechanical fall in the setting of alcohol use and loss of balance.  Head is atraumatic, no injuries based on physical exam.  Head CT visualized by me with no acute traumatic injuries, confirmed by radiology.  Labs with no acute abnormalities.  Patient given Percocet for pain and will be discharged home to the care of her family.  Discussed my standard return precautions and follow-up with PCP       ____________________________________________  Please note:  Patient was evaluated in Emergency Department today for the symptoms described in the history of present illness. Patient was evaluated in the context of the global COVID-19 pandemic, which necessitated consideration that the patient might be at risk for infection with the SARS-CoV-2 virus that causes  COVID-19. Institutional protocols and algorithms that pertain to the evaluation of patients at risk for COVID-19 are in a state of rapid change based on information released by regulatory bodies including the CDC and federal and state organizations. These policies and algorithms were followed during the patient's care in the ED.  Some ED evaluations and interventions may be delayed as a result of limited staffing during the pandemic.   ____________________________________________   FINAL CLINICAL IMPRESSION(S) / ED DIAGNOSES   Final diagnoses:  Fall, initial encounter  Minor head injury, initial encounter      NEW MEDICATIONS STARTED DURING THIS VISIT:  ED Discharge Orders     None        Note:  This document was prepared using Dragon voice recognition software and may include unintentional dictation errors.    Nita Sickle, MD 02/23/21 [phone]

## 2021-02-23 NOTE — Discharge Instructions (Addendum)

## 2021-09-10 ENCOUNTER — Other Ambulatory Visit: Payer: Self-pay | Admitting: Orthopedic Surgery

## 2021-09-10 DIAGNOSIS — M5412 Radiculopathy, cervical region: Secondary | ICD-10-CM

## 2021-09-14 ENCOUNTER — Other Ambulatory Visit: Payer: Self-pay | Admitting: Physician Assistant

## 2021-09-14 DIAGNOSIS — Z1231 Encounter for screening mammogram for malignant neoplasm of breast: Secondary | ICD-10-CM

## 2021-09-22 ENCOUNTER — Other Ambulatory Visit: Payer: Self-pay

## 2021-09-22 ENCOUNTER — Ambulatory Visit
Admission: RE | Admit: 2021-09-22 | Discharge: 2021-09-22 | Disposition: A | Discharge status: Home / Self Care | Payer: Medicaid Other | Source: Ambulatory Visit | Attending: Orthopedic Surgery | Admitting: Orthopedic Surgery

## 2021-09-22 DIAGNOSIS — M5412 Radiculopathy, cervical region: Secondary | ICD-10-CM | POA: Diagnosis not present

## 2021-12-03 ENCOUNTER — Other Ambulatory Visit: Payer: Self-pay

## 2021-12-03 DIAGNOSIS — M4800 Spinal stenosis, site unspecified: Secondary | ICD-10-CM

## 2021-12-03 DIAGNOSIS — M5136 Other intervertebral disc degeneration, lumbar region: Secondary | ICD-10-CM

## 2021-12-11 ENCOUNTER — Ambulatory Visit
Admission: RE | Admit: 2021-12-11 | Discharge: 2021-12-11 | Disposition: A | Discharge status: Home / Self Care | Payer: Medicare Other | Source: Ambulatory Visit | Attending: Physician Assistant | Admitting: Physician Assistant

## 2021-12-11 DIAGNOSIS — Z1231 Encounter for screening mammogram for malignant neoplasm of breast: Secondary | ICD-10-CM | POA: Insufficient documentation

## 2021-12-14 ENCOUNTER — Ambulatory Visit
Admission: RE | Admit: 2021-12-14 | Discharge: 2021-12-14 | Disposition: A | Discharge status: Home / Self Care | Payer: Disability Insurance | Attending: Dentistry | Admitting: Dentistry

## 2021-12-14 ENCOUNTER — Ambulatory Visit
Admission: RE | Admit: 2021-12-14 | Discharge: 2021-12-14 | Disposition: A | Discharge status: Home / Self Care | Payer: Disability Insurance | Source: Ambulatory Visit

## 2021-12-14 DIAGNOSIS — M5136 Other intervertebral disc degeneration, lumbar region: Secondary | ICD-10-CM

## 2021-12-14 DIAGNOSIS — M4800 Spinal stenosis, site unspecified: Secondary | ICD-10-CM | POA: Diagnosis present

## 2023-01-01 ENCOUNTER — Emergency Department
Admission: EM | Admit: 2023-01-01 | Discharge: 2023-01-01 | Disposition: A | Discharge status: Home / Self Care | Payer: Medicare Other | Attending: Emergency Medicine | Admitting: Emergency Medicine

## 2023-01-01 ENCOUNTER — Encounter: Payer: Self-pay | Admitting: *Deleted

## 2023-01-01 ENCOUNTER — Other Ambulatory Visit: Payer: Self-pay

## 2023-01-01 DIAGNOSIS — J45909 Unspecified asthma, uncomplicated: Secondary | ICD-10-CM | POA: Insufficient documentation

## 2023-01-01 DIAGNOSIS — R300 Dysuria: Secondary | ICD-10-CM

## 2023-01-01 DIAGNOSIS — M545 Low back pain, unspecified: Secondary | ICD-10-CM | POA: Diagnosis not present

## 2023-01-01 LAB — URINALYSIS, COMPLETE (UACMP) WITH MICROSCOPIC
Bilirubin Urine: NEGATIVE
Glucose, UA: NEGATIVE mg/dL
Ketones, ur: NEGATIVE mg/dL
Nitrite: NEGATIVE
Protein, ur: NEGATIVE mg/dL
Specific Gravity, Urine: 1.02 (ref 1.005–1.030)
WBC, UA: >50 WBC/hpf (ref 0–5)
pH: 6.5 (ref 5.0–8.0)

## 2023-01-01 LAB — CBC
HCT: 39.6 % (ref 36.0–46.0)
Hemoglobin: 13.2 g/dL (ref 12.0–15.0)
MCH: 26.2 pg (ref 26.0–34.0)
MCHC: 33.3 g/dL (ref 30.0–36.0)
MCV: 78.6 fL — ABNORMAL LOW (ref 80.0–100.0)
Platelets: 344 10*3/uL (ref 150–400)
RBC: 5.04 MIL/uL (ref 3.87–5.11)
RDW: 14.9 % (ref 11.5–15.5)
WBC: 9.3 10*3/uL (ref 4.0–10.5)
nRBC: 0 % (ref 0.0–0.2)

## 2023-01-01 LAB — COMPREHENSIVE METABOLIC PANEL
ALT: 17 U/L (ref 0–44)
AST: 28 U/L (ref 15–41)
Albumin: 4.3 g/dL (ref 3.5–5.0)
Alkaline Phosphatase: 66 U/L (ref 38–126)
Anion gap: 8 (ref 5–15)
BUN: 15 mg/dL (ref 6–20)
CO2: 21 mmol/L — ABNORMAL LOW (ref 22–32)
Calcium: 9.3 mg/dL (ref 8.9–10.3)
Chloride: 106 mmol/L (ref 98–111)
Creatinine, Ser: 0.91 mg/dL (ref 0.44–1.00)
GFR, Estimated: >60 mL/min (ref >60)
Glucose, Bld: 110 mg/dL — ABNORMAL HIGH (ref 70–99)
Potassium: 3.7 mmol/L (ref 3.5–5.1)
Sodium: 135 mmol/L (ref 135–145)
Total Bilirubin: 0.9 mg/dL (ref 0.3–1.2)
Total Protein: 7.4 g/dL (ref 6.5–8.1)

## 2023-01-01 MED ORDER — MORPHINE SULFATE (PF) 2 MG/ML IV SOLN: 2.0000 mg | Freq: Once | INTRAVENOUS | Status: DC

## 2023-01-01 MED ORDER — SODIUM CHLORIDE 0.9 % IV BOLUS
1000.0000 mL | Freq: Once | INTRAVENOUS | Status: AC
  Administered 2023-01-01: 1000 mL via INTRAVENOUS

## 2023-01-01 MED ORDER — OXYCODONE-ACETAMINOPHEN 5-325 MG PO TABS
2.0000 | ORAL_TABLET | Freq: Once | ORAL | Status: AC
  Administered 2023-01-01: 2 via ORAL
  Filled 2023-01-01: qty 2

## 2023-01-01 MED ORDER — ONDANSETRON HCL 4 MG/2ML IJ SOLN
4.0000 mg | Freq: Once | INTRAMUSCULAR | Status: AC
  Administered 2023-01-01: 4 mg via INTRAVENOUS
  Filled 2023-01-01: qty 2

## 2023-01-01 MED ORDER — PREDNISONE 10 MG PO TABS: 10.0000 mg | ORAL_TABLET | Freq: Every day | ORAL | 0 refills | Status: AC

## 2023-01-01 MED ORDER — CEPHALEXIN 500 MG PO CAPS
500.0000 mg | ORAL_CAPSULE | Freq: Once | ORAL | Status: AC
  Administered 2023-01-01: 500 mg via ORAL
  Filled 2023-01-01: qty 1

## 2023-01-01 MED ORDER — CEPHALEXIN 500 MG PO CAPS: 500.0000 mg | ORAL_CAPSULE | Freq: Three times a day (TID) | ORAL | 0 refills | Status: AC

## 2023-01-01 MED ORDER — FENTANYL CITRATE PF 50 MCG/ML IJ SOSY
50.0000 ug | PREFILLED_SYRINGE | Freq: Once | INTRAMUSCULAR | Status: AC
  Administered 2023-01-01: 50 ug via INTRAVENOUS
  Filled 2023-01-01: qty 1

## 2023-01-01 MED ORDER — HYDROMORPHONE HCL 1 MG/ML IJ SOLN
1.0000 mg | Freq: Once | INTRAMUSCULAR | Status: AC
  Administered 2023-01-01: 1 mg via INTRAVENOUS
  Filled 2023-01-01: qty 1

## 2023-01-01 MED ORDER — OXYCODONE-ACETAMINOPHEN 5-325 MG PO TABS: 1.0000 | ORAL_TABLET | ORAL | 0 refills | Status: AC | PRN

## 2023-01-01 MED ORDER — KETOROLAC TROMETHAMINE 30 MG/ML IJ SOLN
15.0000 mg | Freq: Once | INTRAMUSCULAR | Status: AC
  Administered 2023-01-01: 15 mg via INTRAVENOUS
  Filled 2023-01-01: qty 1

## 2023-01-01 NOTE — ED Triage Notes (Addendum)
BIB [facility] from home for recurrent episode of back pain, relates to chronic back pain 2/2 spinal stenosis, onset at 0400 this am when she rolled over in bed, gradually progressively worse, no relief with tylenol, ibuprofen, celebrex, flexeril, cymbalta, ran out of her lyrica, has not been taking her metoprolol, HR 130. Arrives alert, cooperative, writhing in pain, crying hyperventilating, rates 10/10, "was told needs surgery, but hasn't wanted it yet". Ortho at [place].Last ibuprofen at 0200. Last tylenol 2200. Denies sx other than pain.

## 2023-01-01 NOTE — ED Provider Notes (Signed)
Patient received in signout from Dr. Tawanna Sat ski pending follow-up urinalysis.  Patient provided urine does endorse some pressure and discomfort with emptying bladder.  Urine does appear infected.  She is not febrile hemodynamically stable tolerating p.o. do I do think that she be appropriate for outpatient management.  Based on her presentation and absence of red flags given her chronic back pain I do not feel that further diagnostic imaging clinically indicated at this time.  We discussed return precautions and patient is agreeable to plan.   Willy Eddy, MD 01/01/23 1721

## 2023-01-01 NOTE — ED Notes (Signed)
Ready to go, up to b/r, requesting pain med at d/c. VSS. IVF complete.

## 2023-01-01 NOTE — ED Notes (Signed)
Up to b/r with cane and standby assistance. Slow cautious steady gait.

## 2023-01-01 NOTE — ED Notes (Signed)
EDP at BS 

## 2023-01-01 NOTE — ED Provider Notes (Signed)
[facility] Provider Note    Event Date/Time   First MD Initiated Contact with Patient 01/01/23 [phone]     (approximate)  History   Chief Complaint: Back Pain  HPI  Shannon Poole is a 57 y.o. female with a past medical history of asthma, spinal stenosis, who presents to the emergency department for back pain.  According to the patient she has a long history of both neck and lower back pain.  Patient follows up with [facility] orthopedics.  She has been told in the past that she needs surgery but she has been reluctant to do so.  I reviewed the patient's recent orthopedic note from October at which time they were considering a decompression surgery.  Patient states over the past 3 days or so she has had worsening pain in the lower back with occasional radiation down the right leg.  Patient states this is the same area and radiation of her chronic lower back pain but states this is much worse.  Patient holding her back in bed and appears uncomfortable due to pain.  Denies any incontinence.  Denies any weakness or numbness.  Physical Exam   Triage Vital Signs: ED Triage Vitals  Enc Vitals Group     BP 01/01/23 0743 (!) 116/57     Pulse Rate 01/01/23 0730 (!) 131     Resp 01/01/23 0735 (!) 23     Temp 01/01/23 0743 100 F (37.8 C)     Temp Source 01/01/23 0743 Oral     SpO2 01/01/23 0730 100 %     Weight 01/01/23 0738 180 lb (81.6 kg)     Height --      Head Circumference --      Peak Flow --      Pain Score 01/01/23 0738 10     Pain Loc --      Pain Edu? --      Excl. in GC? --     Most recent vital signs: Vitals:   01/01/23 0740 01/01/23 0743  BP:  (!) 116/57  Pulse: (!) 124 (!) 126  Resp: (!) 22 (!) 22  Temp:  100 F (37.8 C)  SpO2: 100% 99%    General: Awake, moderate distress due to lower back pain. CV:  Good peripheral perfusion.  Regular rhythm rate around 120 bpm. Resp:  Normal effort.  Equal breath sounds bilaterally.  Abd:  No distention.   Soft, nontender.  No rebound or guarding. Other:  Good range of motion and strength in bilateral lower extremities.  ED Results / Procedures / Treatments   EKG  EKG viewed and interpreted by myself shows a sinus tachycardia 130 bpm with a narrow QRS, normal axis, slight QTc prolongation otherwise normal intervals.  No concerning ST changes.   MEDICATIONS ORDERED IN ED: Medications  HYDROmorphone (DILAUDID) injection 1 mg (has no administration in time range)  sodium chloride 0.9 % bolus 1,000 mL (has no administration in time range)  ondansetron (ZOFRAN) injection 4 mg (has no administration in time range)     IMPRESSION / MDM / ASSESSMENT AND PLAN / ED COURSE  I reviewed the triage vital signs and the nursing notes.  Patient's presentation is most consistent with acute presentation with potential threat to life or bodily function.  Patient presents to the emergency department for acute on chronic lower back pain.  States a long history of back pain sees [facility] orthopedics states 3 days ago she exacerbated it she is  not sure what the exacerbating injury was but it has been hurting worse.  Patient states this morning she was in severe pain so she came to the emergency department for evaluation.  Patient is moderately uncomfortable on exam.  I reviewed the patient's recent [facility] orthopedic note from October 2023 patient suffers from spinal stenosis, and the cervical spine, as well as chronic lower back pain per patient.  [facility] orthopedics recommended a decompression surgery in October but the patient admits that she has been reluctant to follow through with this.  Patient denies any weakness or numbness denies any incontinence.  We will check labs treat pain and continue to closely monitor.  Patient agreeable to plan of care.  Patient's workup is reassuring, CBC is normal, chemistry is normal.  EKG shows no significant findings but sinus tachycardia.  Initial temperature of 100.0 on arrival however I  rechecked the temperature with no antipyretic and the patient is down to 98.4.  We will obtain a urine sample as a precaution.  Patient denies any fever at home.  FINAL CLINICAL IMPRESSION(S) / ED DIAGNOSES   Acute on chronic lower back pain  Rx / DC Orders   ***  Note:  This document was prepared using Dragon voice recognition software and may include unintentional dictation errors.

## 2023-01-01 NOTE — ED Notes (Signed)
Reports no urine (therefore, no sample) produced.

## 2023-01-01 NOTE — Discharge Instructions (Signed)
Please take your pain medication as needed but only as prescribed.  Do not drink alcohol or drive while taking this medication.  Please take your prednisone for its entire course as prescribed.  Return to the emergency department immediately if you have any weakness or numbness of either leg, develop any fever or incontinence (bladder or bowel accidents), or any other symptom personally concerning to yourself.

## 2023-04-29 IMAGING — CT CT CERVICAL SPINE W/O CM
2 series · 10 of 14 positions shown, 12 images · non-contrast
Comparison: None.

CLINICAL DATA: Neck pain, right hand numbness



[Series 4: c spine soft · axial · 0.33mm/px · z∈[-491,-379]mm · 4 of 94 slices shown]
[im 19/94  soft-tissue]
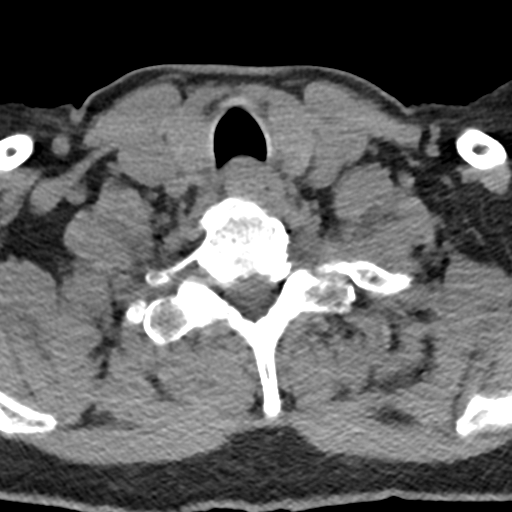
[im 38/94  soft-tissue]
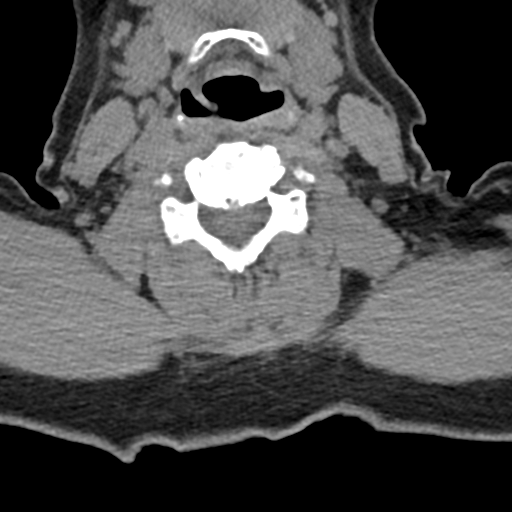
[im 56/94  soft-tissue]
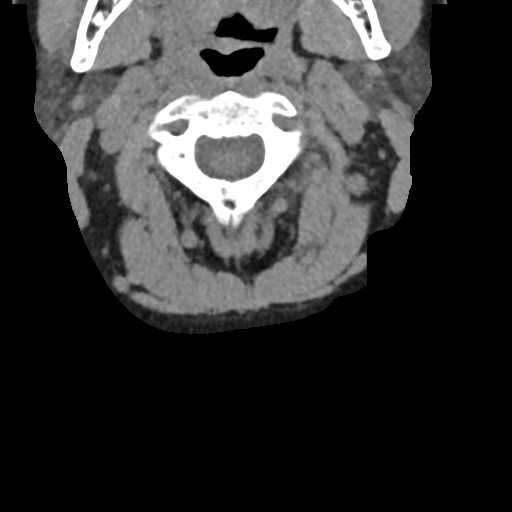
[im 75/94  soft-tissue]
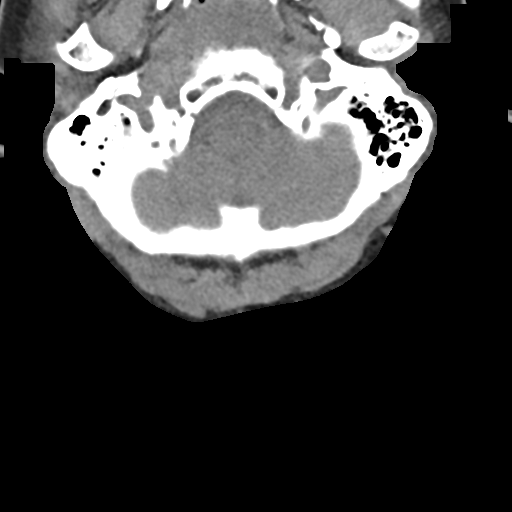

[Series 6: orthogonal bone · axial · 0.23mm/px · z∈[-547,-401]mm · 6 of 114 slices shown, 8 images]
[im 17/114  soft-tissue]
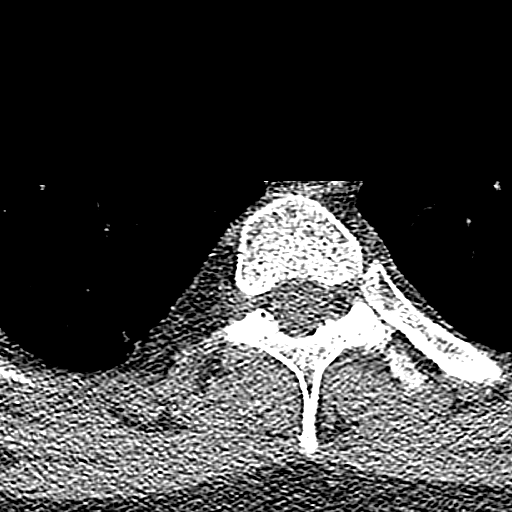
[im 17/114  bone]
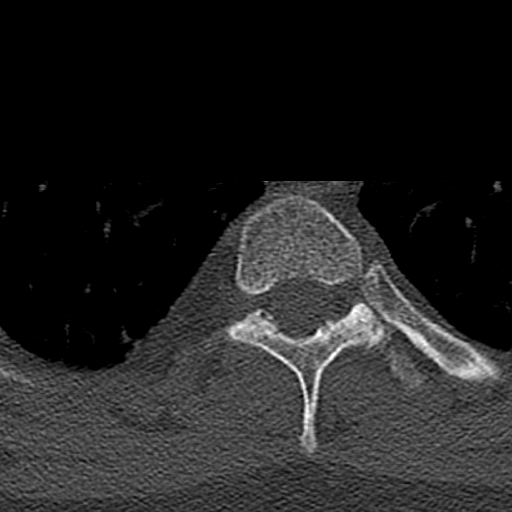
[im 33/114  bone]
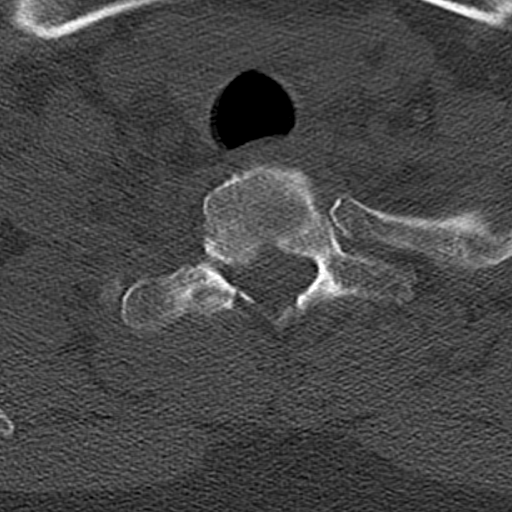
[im 49/114  bone]
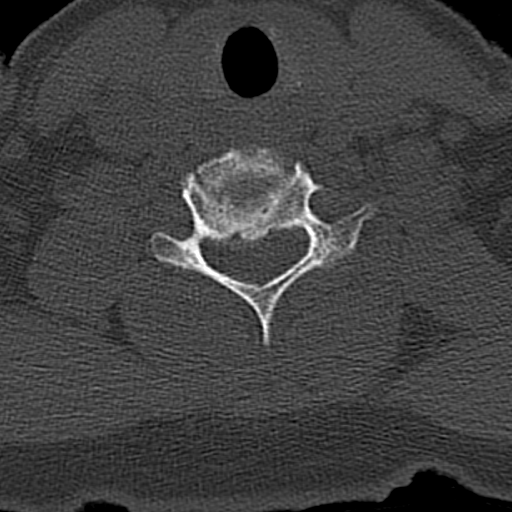
[im 65/114  bone]
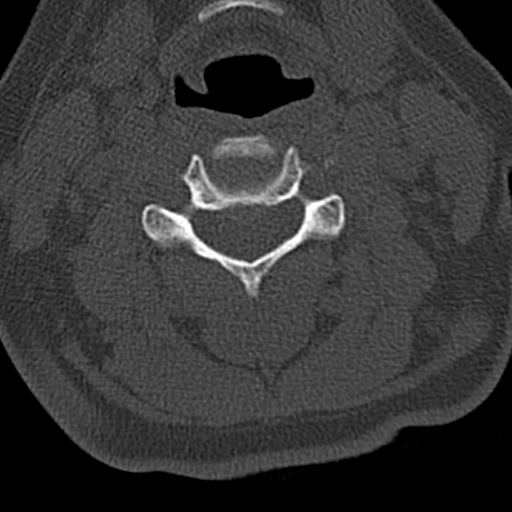
[im 81/114  soft-tissue]
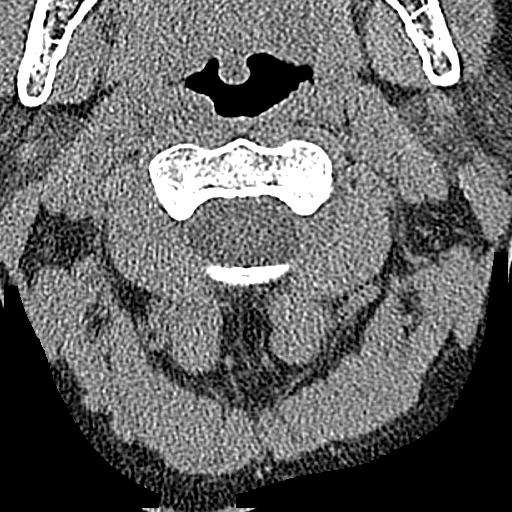
[im 81/114  bone]
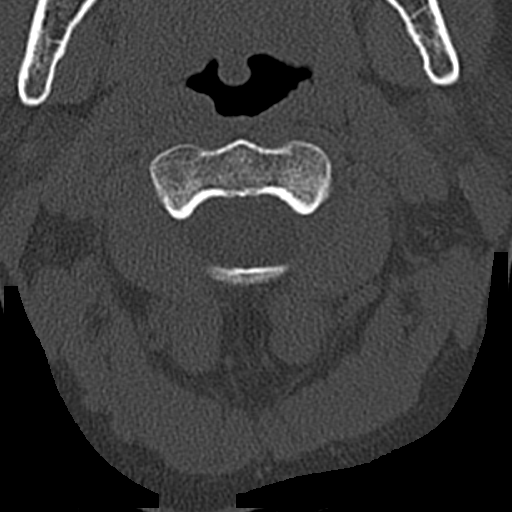
[im 97/114  bone]
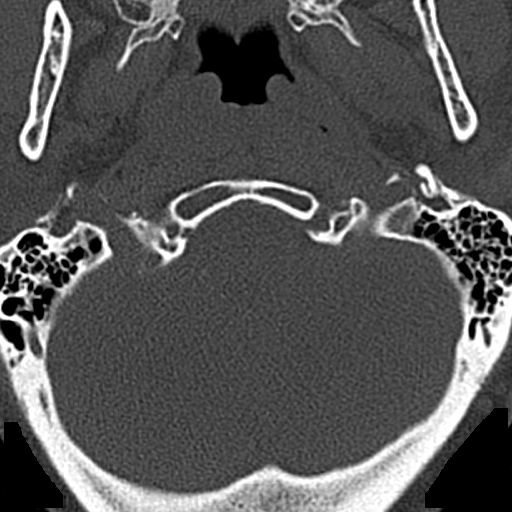

[10 of 14 positions shown; findings below may reference images not displayed]

FINDINGS: Alignment: Alignment of posterior margins of vertebral bodies is
unremarkable.

Skull base and vertebrae: No recent fracture is seen.

Soft tissues and spinal canal: There is extrinsic pressure over the
ventral margin of thecal sac caused by posterior bony spurs from
C4-C7 levels. There is mild spinal stenosis at C4-C5 level. There is
moderate spinal stenosis at C5-C6 level. There is mild to moderate
spinal stenosis at C6-C7 level.

Disc levels: There is mild encroachment of left neural foramina at
C3-C4 level.There is moderate to marked encroachment of neural
foramina at C4-C5 level. There is moderate to marked encroachment of
neural foramina at C5-C6 level. There is moderate to marked
encroachment of neural foramina at C6-C7 level, more so on the left
side. There is mild to moderate encroachment of neural foramina at
C7-T1 level, more so on the left side.

Upper chest: Unremarkable.

Other: Thyroid is difficult to visualize. There is possible
inhomogeneous attenuation in the thyroid. There are subcentimeter
nodes in both sides of neck.
IMPRESSION: No recent fracture or dislocation is seen.

Cervical spondylosis with spinal stenosis at multiple levels, more
so at C5-C6 level. There is encroachment of neural foramina from
C3-T1 levels as described in the body of the report.
